# Patient Record
Sex: Female | Born: 1935 | Race: White | Hispanic: No | Marital: Married | Smoking: Never smoker
Health system: Southern US, Community
[De-identification: ages and names within clinical notes are randomized; demographics above are authoritative.]

## PROBLEM LIST (undated history)

## (undated) DIAGNOSIS — M810 Age-related osteoporosis without current pathological fracture: Secondary | ICD-10-CM

## (undated) DIAGNOSIS — M419 Scoliosis, unspecified: Secondary | ICD-10-CM

## (undated) DIAGNOSIS — I10 Essential (primary) hypertension: Secondary | ICD-10-CM

## (undated) HISTORY — PX: TONSILLECTOMY: SUR1361

## (undated) HISTORY — PX: DILATION AND CURETTAGE OF UTERUS: SHX78

## (undated) HISTORY — PX: TUBAL LIGATION: SHX77

---

## 2005-08-13 ENCOUNTER — Ambulatory Visit: Payer: Self-pay | Admitting: Family Medicine

## 2005-08-27 ENCOUNTER — Ambulatory Visit: Payer: Self-pay | Admitting: Family Medicine

## 2006-03-03 ENCOUNTER — Ambulatory Visit: Payer: Self-pay | Admitting: Family Medicine

## 2006-10-16 ENCOUNTER — Ambulatory Visit: Payer: Self-pay | Admitting: Family Medicine

## 2007-11-03 ENCOUNTER — Ambulatory Visit: Payer: Self-pay | Admitting: Family Medicine

## 2008-11-04 ENCOUNTER — Ambulatory Visit: Payer: Self-pay | Admitting: Family Medicine

## 2009-12-04 ENCOUNTER — Ambulatory Visit: Payer: Self-pay | Admitting: Family Medicine

## 2009-12-20 ENCOUNTER — Ambulatory Visit: Payer: Self-pay | Admitting: Family Medicine

## 2010-12-18 ENCOUNTER — Ambulatory Visit: Payer: Self-pay | Admitting: Family Medicine

## 2012-03-05 ENCOUNTER — Ambulatory Visit: Payer: Self-pay | Admitting: Family Medicine

## 2013-04-19 ENCOUNTER — Ambulatory Visit: Payer: Self-pay | Admitting: Family Medicine

## 2014-05-16 ENCOUNTER — Ambulatory Visit: Payer: Self-pay | Admitting: Family Medicine

## 2015-05-23 ENCOUNTER — Other Ambulatory Visit: Payer: Self-pay | Admitting: Family Medicine

## 2015-05-23 DIAGNOSIS — Z1231 Encounter for screening mammogram for malignant neoplasm of breast: Secondary | ICD-10-CM

## 2015-05-24 ENCOUNTER — Other Ambulatory Visit: Payer: Self-pay | Admitting: Family Medicine

## 2015-05-24 ENCOUNTER — Ambulatory Visit
Admission: RE | Admit: 2015-05-24 | Discharge: 2015-05-24 | Disposition: A | Discharge status: Home / Self Care | Payer: Medicare Other | Source: Ambulatory Visit | Attending: Family Medicine | Admitting: Family Medicine

## 2015-05-24 DIAGNOSIS — Z1231 Encounter for screening mammogram for malignant neoplasm of breast: Secondary | ICD-10-CM

## 2016-05-30 ENCOUNTER — Other Ambulatory Visit: Payer: Self-pay | Admitting: Family Medicine

## 2016-05-30 DIAGNOSIS — Z1231 Encounter for screening mammogram for malignant neoplasm of breast: Secondary | ICD-10-CM

## 2016-06-20 ENCOUNTER — Ambulatory Visit
Admission: RE | Admit: 2016-06-20 | Discharge: 2016-06-20 | Disposition: A | Discharge status: Home / Self Care | Payer: Medicare Other | Source: Ambulatory Visit | Attending: Family Medicine | Admitting: Family Medicine

## 2016-06-20 ENCOUNTER — Other Ambulatory Visit: Payer: Self-pay | Admitting: Family Medicine

## 2016-06-20 DIAGNOSIS — Z1231 Encounter for screening mammogram for malignant neoplasm of breast: Secondary | ICD-10-CM | POA: Diagnosis present

## 2017-06-11 ENCOUNTER — Other Ambulatory Visit: Payer: Self-pay | Admitting: Family Medicine

## 2017-06-11 DIAGNOSIS — Z1231 Encounter for screening mammogram for malignant neoplasm of breast: Secondary | ICD-10-CM

## 2017-07-02 ENCOUNTER — Ambulatory Visit
Admission: RE | Admit: 2017-07-02 | Discharge: 2017-07-02 | Disposition: A | Discharge status: Home / Self Care | Payer: Medicare Other | Source: Ambulatory Visit | Attending: Family Medicine | Admitting: Family Medicine

## 2017-07-02 DIAGNOSIS — Z1231 Encounter for screening mammogram for malignant neoplasm of breast: Secondary | ICD-10-CM

## 2018-05-13 ENCOUNTER — Other Ambulatory Visit: Payer: Self-pay | Admitting: Family Medicine

## 2018-05-13 DIAGNOSIS — M810 Age-related osteoporosis without current pathological fracture: Secondary | ICD-10-CM

## 2018-06-02 ENCOUNTER — Other Ambulatory Visit: Payer: Self-pay | Admitting: Family Medicine

## 2018-06-02 DIAGNOSIS — Z1231 Encounter for screening mammogram for malignant neoplasm of breast: Secondary | ICD-10-CM

## 2018-06-03 ENCOUNTER — Ambulatory Visit
Admission: RE | Admit: 2018-06-03 | Discharge: 2018-06-03 | Disposition: A | Discharge status: Home / Self Care | Payer: Medicare Other | Source: Ambulatory Visit | Attending: Family Medicine | Admitting: Family Medicine

## 2018-06-03 DIAGNOSIS — M810 Age-related osteoporosis without current pathological fracture: Secondary | ICD-10-CM | POA: Insufficient documentation

## 2018-07-06 ENCOUNTER — Ambulatory Visit
Admission: RE | Admit: 2018-07-06 | Discharge: 2018-07-06 | Disposition: A | Discharge status: Home / Self Care | Payer: Medicare Other | Source: Ambulatory Visit | Attending: Family Medicine | Admitting: Family Medicine

## 2018-07-06 DIAGNOSIS — Z1231 Encounter for screening mammogram for malignant neoplasm of breast: Secondary | ICD-10-CM | POA: Insufficient documentation

## 2020-07-19 ENCOUNTER — Other Ambulatory Visit: Payer: Self-pay

## 2020-07-19 ENCOUNTER — Encounter: Payer: Self-pay | Admitting: Ophthalmology

## 2020-07-24 ENCOUNTER — Other Ambulatory Visit
Admission: RE | Admit: 2020-07-24 | Discharge: 2020-07-24 | Disposition: A | Discharge status: Home / Self Care | Payer: Medicare PPO | Source: Ambulatory Visit | Attending: Ophthalmology | Admitting: Ophthalmology

## 2020-07-24 ENCOUNTER — Other Ambulatory Visit: Payer: Self-pay

## 2020-07-24 DIAGNOSIS — Z01812 Encounter for preprocedural laboratory examination: Secondary | ICD-10-CM | POA: Insufficient documentation

## 2020-07-24 DIAGNOSIS — Z20822 Contact with and (suspected) exposure to covid-19: Secondary | ICD-10-CM | POA: Diagnosis not present

## 2020-07-25 LAB — SARS CORONAVIRUS 2 (TAT 6-24 HRS): SARS Coronavirus 2: NEGATIVE

## 2020-07-25 NOTE — Discharge Instructions (Signed)

## 2020-07-26 ENCOUNTER — Other Ambulatory Visit: Payer: Self-pay

## 2020-07-26 ENCOUNTER — Encounter
Admission: RE | Disposition: A | Discharge status: Home / Self Care | Payer: Self-pay | Source: Home / Self Care | Attending: Ophthalmology

## 2020-07-26 ENCOUNTER — Encounter: Payer: Self-pay | Admitting: Ophthalmology

## 2020-07-26 ENCOUNTER — Ambulatory Visit
Admission: RE | Admit: 2020-07-26 | Discharge: 2020-07-26 | Disposition: A | Discharge status: Home / Self Care | Payer: Medicare PPO | Attending: Ophthalmology | Admitting: Ophthalmology

## 2020-07-26 ENCOUNTER — Ambulatory Visit: Payer: Medicare PPO | Admitting: Anesthesiology

## 2020-07-26 DIAGNOSIS — I10 Essential (primary) hypertension: Secondary | ICD-10-CM | POA: Diagnosis not present

## 2020-07-26 DIAGNOSIS — H2512 Age-related nuclear cataract, left eye: Secondary | ICD-10-CM | POA: Insufficient documentation

## 2020-07-26 DIAGNOSIS — M1990 Unspecified osteoarthritis, unspecified site: Secondary | ICD-10-CM | POA: Insufficient documentation

## 2020-07-26 DIAGNOSIS — Z79899 Other long term (current) drug therapy: Secondary | ICD-10-CM | POA: Insufficient documentation

## 2020-07-26 DIAGNOSIS — H5703 Miosis: Secondary | ICD-10-CM | POA: Diagnosis not present

## 2020-07-26 DIAGNOSIS — I272 Pulmonary hypertension, unspecified: Secondary | ICD-10-CM | POA: Diagnosis not present

## 2020-07-26 DIAGNOSIS — M810 Age-related osteoporosis without current pathological fracture: Secondary | ICD-10-CM | POA: Diagnosis not present

## 2020-07-26 HISTORY — DX: Age-related osteoporosis without current pathological fracture: M81.0

## 2020-07-26 HISTORY — DX: Essential (primary) hypertension: I10

## 2020-07-26 HISTORY — PX: CATARACT EXTRACTION W/PHACO: SHX586

## 2020-07-26 HISTORY — DX: Scoliosis, unspecified: M41.9

## 2020-07-26 SURGERY — PHACOEMULSIFICATION, CATARACT, WITH IOL INSERTION
Anesthesia: Monitor Anesthesia Care | Site: Eye | Laterality: Left

## 2020-07-26 MED ORDER — NA HYALUR & NA CHOND-NA HYALUR 0.4-0.35 ML IO KIT
PACK | INTRAOCULAR | Status: DC | PRN
  Administered 2020-07-26: 1 mL via INTRAOCULAR

## 2020-07-26 MED ORDER — EPINEPHRINE PF 1 MG/ML IJ SOLN
INTRAOCULAR | Status: DC | PRN
  Administered 2020-07-26: 90 mL via OPHTHALMIC

## 2020-07-26 MED ORDER — ONDANSETRON HCL 4 MG/2ML IJ SOLN: 4.0000 mg | Freq: Once | INTRAMUSCULAR | Status: DC | PRN

## 2020-07-26 MED ORDER — MIDAZOLAM HCL 2 MG/2ML IJ SOLN
INTRAMUSCULAR | Status: DC | PRN
  Administered 2020-07-26: .5 mg via INTRAVENOUS

## 2020-07-26 MED ORDER — MOXIFLOXACIN HCL 0.5 % OP SOLN
1.0000 [drp] | OPHTHALMIC | Status: DC | PRN
  Administered 2020-07-26 (×3): 1 [drp] via OPHTHALMIC

## 2020-07-26 MED ORDER — LACTATED RINGERS IV SOLN: INTRAVENOUS | Status: DC

## 2020-07-26 MED ORDER — BRIMONIDINE TARTRATE-TIMOLOL 0.2-0.5 % OP SOLN
OPHTHALMIC | Status: DC | PRN
  Administered 2020-07-26: 1 [drp] via OPHTHALMIC

## 2020-07-26 MED ORDER — ARMC OPHTHALMIC DILATING DROPS
1.0000 "application " | OPHTHALMIC | Status: DC | PRN
  Administered 2020-07-26 (×3): 1 via OPHTHALMIC

## 2020-07-26 MED ORDER — ACETAMINOPHEN 10 MG/ML IV SOLN: 1000.0000 mg | Freq: Once | INTRAVENOUS | Status: DC | PRN

## 2020-07-26 MED ORDER — FENTANYL CITRATE (PF) 100 MCG/2ML IJ SOLN
INTRAMUSCULAR | Status: DC | PRN
Start: 2020-07-26 — End: 2020-07-26
  Administered 2020-07-26 (×2): 50 ug via INTRAVENOUS

## 2020-07-26 MED ORDER — TETRACAINE HCL 0.5 % OP SOLN
1.0000 [drp] | OPHTHALMIC | Status: DC | PRN
  Administered 2020-07-26 (×3): 1 [drp] via OPHTHALMIC

## 2020-07-26 MED ORDER — LIDOCAINE HCL (PF) 2 % IJ SOLN
INTRAOCULAR | Status: DC | PRN
  Administered 2020-07-26: 2 mL

## 2020-07-26 MED ORDER — CEFUROXIME OPHTHALMIC INJECTION 1 MG/0.1 ML
INJECTION | OPHTHALMIC | Status: DC | PRN
  Administered 2020-07-26: 0.1 mL via INTRACAMERAL

## 2020-07-26 SURGICAL SUPPLY — 24 items
CANNULA ANT/CHMB 27G (MISCELLANEOUS) ×1 IMPLANT
CANNULA ANT/CHMB 27GA (MISCELLANEOUS) ×3 IMPLANT
GLOVE SURG LX 7.5 STRW (GLOVE) ×2
GLOVE SURG LX STRL 7.5 STRW (GLOVE) ×1 IMPLANT
GLOVE SURG TRIUMPH 8.0 PF LTX (GLOVE) ×3 IMPLANT
GOWN STRL REUS W/ TWL LRG LVL3 (GOWN DISPOSABLE) ×2 IMPLANT
GOWN STRL REUS W/TWL LRG LVL3 (GOWN DISPOSABLE) ×6
LENS IOL DIOP 22.0 (Intraocular Lens) ×3 IMPLANT
LENS IOL TECNIS MONO 22.0 (Intraocular Lens) IMPLANT
MARKER SKIN DUAL TIP RULER LAB (MISCELLANEOUS) ×3 IMPLANT
NDL CAPSULORHEX 25GA (NEEDLE) ×1 IMPLANT
NDL FILTER BLUNT 18X1 1/2 (NEEDLE) ×2 IMPLANT
NEEDLE CAPSULORHEX 25GA (NEEDLE) ×3 IMPLANT
NEEDLE FILTER BLUNT 18X 1/2SAF (NEEDLE) ×4
NEEDLE FILTER BLUNT 18X1 1/2 (NEEDLE) ×2 IMPLANT
PACK CATARACT BRASINGTON (MISCELLANEOUS) ×3 IMPLANT
PACK EYE AFTER SURG (MISCELLANEOUS) ×3 IMPLANT
PACK OPTHALMIC (MISCELLANEOUS) ×3 IMPLANT
RING MALYGIN 7.0 (MISCELLANEOUS) ×2 IMPLANT
SOLUTION OPHTHALMIC SALT (MISCELLANEOUS) ×3 IMPLANT
SYR 3ML LL SCALE MARK (SYRINGE) ×6 IMPLANT
SYR TB 1ML LUER SLIP (SYRINGE) ×3 IMPLANT
WATER STERILE IRR 250ML POUR (IV SOLUTION) ×3 IMPLANT
WIPE NON LINTING 3.25X3.25 (MISCELLANEOUS) ×3 IMPLANT

## 2020-07-26 NOTE — Anesthesia Procedure Notes (Signed)
Procedure Name: MAC Date/Time: 07/26/2020 12:33 PM Performed by: Vanetta Shawl, CRNA Pre-anesthesia Checklist: Patient identified, Emergency Drugs available, Suction available, Timeout performed and Patient being monitored Patient Re-evaluated:Patient Re-evaluated prior to induction Oxygen Delivery Method: Nasal cannula Placement Confirmation: positive ETCO2

## 2020-07-26 NOTE — Op Note (Signed)
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     PROCEDURE:  Phacoemulsification with posterior chamber intraocular lens implantation of the left eye which required pupil stretching with the Malyugin pupil expansion device  Ultrasound time: Procedure(s): CATARACT EXTRACTION PHACO AND INTRAOCULAR LENS PLACEMENT (IOC) LEFT MALYUGIN 5.78 01:12.2 8.0% (Left)  LENS:   Implant Name Type Inv. Item Serial No. Manufacturer Lot No. LRB No. Used Action  LENS IOL DIOP 22.0 - [id number] Intraocular Lens LENS IOL DIOP 22.0 [id number] [employer]  Left 1 Implanted     SURGEON:  Deirdre Evener, MD   ANESTHESIA: Topical with tetracaine drops and 2% Xylocaine jelly, augmented with 1% preservative-free intracameral lidocaine.   COMPLICATIONS:  None.   DESCRIPTION OF PROCEDURE:  The patient was identified in the holding room and transported to the operating room and placed in the supine position under the operating microscope.  The left eye was identified as the operative eye and it was prepped and draped in the usual sterile ophthalmic fashion.   A 1 millimeter clear-corneal paracentesis was made at the 1:30 position.  The anterior chamber was filled with Viscoat viscoelastic.  0.5 ml of preservative-free 1% lidocaine was injected into the anterior chamber.  A 2.4 millimeter keratome was used to make a near-clear corneal incision at the 10:30 position.  A Malyugin pupil expander was then placed through the main incision and into the anterior chamber of the eye.  The edge of the iris was secured on the lip of the pupil expander and it was released, thereby expanding the pupil to approximately 7 millimeters for completion of the cataract surgery.  Additional Viscoat was placed in the anterior chamber.  A  cystotome and capsulorrhexis forceps were used to make a curvilinear capsulorrhexis.   Balanced salt solution was used to hydrodissect and hydrodelineate the lens nucleus.   Phacoemulsification was used in stop and chop fashion to remove the lens, nucleus and epinucleus.  The remaining cortex was aspirated using the irrigation aspiration handpiece.  Additional Provisc was placed into the eye to distend the capsular bag for lens placement.  A lens was then injected into the capsular bag.  The pupil expanding ring was removed using a Kuglen hook and insertion device. The remaining viscoelastic was aspirated from the capsular bag and the anterior chamber.  The anterior chamber was filled with balanced salt solution to inflate to a physiologic pressure.   Wounds were hydrated with balanced salt solution.  The anterior chamber was inflated to a physiologic pressure with balanced salt solution.  No wound leaks were noted. Cefuroxime 0.1 ml of a 10mg /ml solution was injected into the anterior chamber for a dose of 1 mg of intracameral antibiotic at the completion of the case.   Timolol and Brimonidine drops were applied to the eye.  The patient was taken to the recovery room in stable condition without complications of anesthesia or surgery.  Krystal Teachey 07/26/2020, 1:02 PM

## 2020-07-26 NOTE — Anesthesia Postprocedure Evaluation (Signed)
Anesthesia Post Note  Patient: Cindy Reed  Procedure(s) Performed: CATARACT EXTRACTION PHACO AND INTRAOCULAR LENS PLACEMENT (IOC) LEFT MALYUGIN 5.78 01:12.2 8.0% (Left Eye)     Patient location during evaluation: PACU Anesthesia Type: MAC Level of consciousness: awake and alert Pain management: pain level controlled Vital Signs Assessment: post-procedure vital signs reviewed and stable Respiratory status: spontaneous breathing, nonlabored ventilation, respiratory function stable and patient connected to nasal cannula oxygen Cardiovascular status: stable and blood pressure returned to baseline Postop Assessment: no apparent nausea or vomiting Anesthetic complications: no   No complications documented.  Shrita Thien A  Jac Romulus

## 2020-07-26 NOTE — Anesthesia Preprocedure Evaluation (Signed)
Anesthesia Evaluation  Patient identified by MRN, date of birth, ID band Patient awake    Reviewed: Allergy & Precautions, NPO status , Patient's Chart, lab work & pertinent test results, reviewed documented beta blocker date and time   History of Anesthesia Complications Negative for: history of anesthetic complications  Airway Mallampati: III  TM Distance: >3 FB Neck ROM: Full  Mouth opening: Limited Mouth Opening  Dental   Pulmonary   Moderate pHTN   breath sounds clear to auscultation       Cardiovascular hypertension, (-) angina(-) DOE + Valvular Problems/Murmurs  Rhythm:Regular Rate:Normal   HLD  TTE (08/2019): MODERATE TR  MODERATE PHTN  MILD AR, MR  EF 55%    Neuro/Psych    GI/Hepatic neg GERD  ,  Endo/Other    Renal/GU      Musculoskeletal  (+) Arthritis ,   Abdominal   Peds  Hematology   Anesthesia Other Findings   Reproductive/Obstetrics                            Anesthesia Physical Anesthesia Plan  ASA: II  Anesthesia Plan: MAC   Post-op Pain Management:    Induction: Intravenous  PONV Risk Score and Plan: 2 and TIVA, Midazolam and Treatment may vary due to age or medical condition  Airway Management Planned: Nasal Cannula  Additional Equipment:   Intra-op Plan:   Post-operative Plan:   Informed Consent: I have reviewed the patients History and Physical, chart, labs and discussed the procedure including the risks, benefits and alternatives for the proposed anesthesia with the patient or authorized representative who has indicated his/her understanding and acceptance.       Plan Discussed with: CRNA and Anesthesiologist  Anesthesia Plan Comments:         Anesthesia Quick Evaluation

## 2020-07-26 NOTE — H&P (Signed)

## 2020-07-26 NOTE — Transfer of Care (Signed)
Immediate Anesthesia Transfer of Care Note  Patient: Cindy Reed  Procedure(s) Performed: CATARACT EXTRACTION PHACO AND INTRAOCULAR LENS PLACEMENT (IOC) LEFT MALYUGIN 5.78 01:12.2 8.0% (Left Eye)  Patient Location: PACU  Anesthesia Type: MAC  Level of Consciousness: awake, alert  and patient cooperative  Airway and Oxygen Therapy: Patient Spontanous Breathing   Post-op Assessment: Post-op Vital signs reviewed, Patient's Cardiovascular Status Stable, Respiratory Function Stable, Patent Airway and No signs of Nausea or vomiting  Post-op Vital Signs: Reviewed and stable  Complications: No complications documented.

## 2020-07-27 ENCOUNTER — Encounter: Payer: Self-pay | Admitting: Ophthalmology

## 2020-08-03 ENCOUNTER — Encounter: Payer: Self-pay | Admitting: Ophthalmology

## 2020-08-14 ENCOUNTER — Other Ambulatory Visit: Payer: Self-pay

## 2020-08-14 ENCOUNTER — Other Ambulatory Visit
Admission: RE | Admit: 2020-08-14 | Discharge: 2020-08-14 | Disposition: A | Discharge status: Home / Self Care | Payer: Medicare PPO | Source: Ambulatory Visit | Attending: Ophthalmology | Admitting: Ophthalmology

## 2020-08-14 DIAGNOSIS — Z01812 Encounter for preprocedural laboratory examination: Secondary | ICD-10-CM | POA: Diagnosis present

## 2020-08-14 DIAGNOSIS — Z20822 Contact with and (suspected) exposure to covid-19: Secondary | ICD-10-CM | POA: Diagnosis not present

## 2020-08-14 NOTE — Discharge Instructions (Signed)

## 2020-08-15 LAB — SARS CORONAVIRUS 2 (TAT 6-24 HRS): SARS Coronavirus 2: NEGATIVE

## 2020-08-16 ENCOUNTER — Other Ambulatory Visit: Payer: Self-pay

## 2020-08-16 ENCOUNTER — Encounter
Admission: RE | Disposition: A | Discharge status: Home / Self Care | Payer: Self-pay | Source: Home / Self Care | Attending: Ophthalmology

## 2020-08-16 ENCOUNTER — Ambulatory Visit: Payer: Medicare PPO | Admitting: Anesthesiology

## 2020-08-16 ENCOUNTER — Encounter: Payer: Self-pay | Admitting: Ophthalmology

## 2020-08-16 ENCOUNTER — Ambulatory Visit
Admission: RE | Admit: 2020-08-16 | Discharge: 2020-08-16 | Disposition: A | Discharge status: Home / Self Care | Payer: Medicare PPO | Attending: Ophthalmology | Admitting: Ophthalmology

## 2020-08-16 DIAGNOSIS — H2511 Age-related nuclear cataract, right eye: Secondary | ICD-10-CM | POA: Diagnosis present

## 2020-08-16 DIAGNOSIS — M810 Age-related osteoporosis without current pathological fracture: Secondary | ICD-10-CM | POA: Diagnosis not present

## 2020-08-16 DIAGNOSIS — M1990 Unspecified osteoarthritis, unspecified site: Secondary | ICD-10-CM | POA: Diagnosis not present

## 2020-08-16 DIAGNOSIS — Z7983 Long term (current) use of bisphosphonates: Secondary | ICD-10-CM | POA: Diagnosis not present

## 2020-08-16 DIAGNOSIS — Z79899 Other long term (current) drug therapy: Secondary | ICD-10-CM | POA: Diagnosis not present

## 2020-08-16 DIAGNOSIS — H5703 Miosis: Secondary | ICD-10-CM | POA: Diagnosis not present

## 2020-08-16 DIAGNOSIS — I272 Pulmonary hypertension, unspecified: Secondary | ICD-10-CM | POA: Insufficient documentation

## 2020-08-16 DIAGNOSIS — I10 Essential (primary) hypertension: Secondary | ICD-10-CM | POA: Insufficient documentation

## 2020-08-16 HISTORY — PX: CATARACT EXTRACTION W/PHACO: SHX586

## 2020-08-16 SURGERY — PHACOEMULSIFICATION, CATARACT, WITH IOL INSERTION
Anesthesia: Monitor Anesthesia Care | Site: Eye | Laterality: Right

## 2020-08-16 MED ORDER — LIDOCAINE HCL (PF) 2 % IJ SOLN
INTRAOCULAR | Status: DC | PRN
  Administered 2020-08-16: 2 mL

## 2020-08-16 MED ORDER — FENTANYL CITRATE (PF) 100 MCG/2ML IJ SOLN
INTRAMUSCULAR | Status: DC | PRN
Start: 2020-08-16 — End: 2020-08-16
  Administered 2020-08-16 (×2): 50 ug via INTRAVENOUS

## 2020-08-16 MED ORDER — BRIMONIDINE TARTRATE-TIMOLOL 0.2-0.5 % OP SOLN
OPHTHALMIC | Status: DC | PRN
  Administered 2020-08-16: 1 [drp] via OPHTHALMIC

## 2020-08-16 MED ORDER — ARMC OPHTHALMIC DILATING DROPS
1.0000 "application " | OPHTHALMIC | Status: DC | PRN
  Administered 2020-08-16 (×3): 1 via OPHTHALMIC

## 2020-08-16 MED ORDER — EPINEPHRINE PF 1 MG/ML IJ SOLN
INTRAOCULAR | Status: DC | PRN
  Administered 2020-08-16: 69 mL via OPHTHALMIC

## 2020-08-16 MED ORDER — CEFUROXIME OPHTHALMIC INJECTION 1 MG/0.1 ML
INJECTION | OPHTHALMIC | Status: DC | PRN
  Administered 2020-08-16: 0.1 mL via INTRACAMERAL

## 2020-08-16 MED ORDER — ONDANSETRON HCL 4 MG/2ML IJ SOLN: 4.0000 mg | Freq: Once | INTRAMUSCULAR | Status: DC | PRN

## 2020-08-16 MED ORDER — TETRACAINE HCL 0.5 % OP SOLN
1.0000 [drp] | OPHTHALMIC | Status: DC | PRN
  Administered 2020-08-16 (×3): 1 [drp] via OPHTHALMIC

## 2020-08-16 MED ORDER — NA HYALUR & NA CHOND-NA HYALUR 0.4-0.35 ML IO KIT
PACK | INTRAOCULAR | Status: DC | PRN
  Administered 2020-08-16: 1 mL via INTRAOCULAR

## 2020-08-16 MED ORDER — ACETAMINOPHEN 325 MG PO TABS: 325.0000 mg | ORAL_TABLET | ORAL | Status: DC | PRN

## 2020-08-16 MED ORDER — MOXIFLOXACIN HCL 0.5 % OP SOLN
1.0000 [drp] | OPHTHALMIC | Status: DC | PRN
  Administered 2020-08-16 (×3): 1 [drp] via OPHTHALMIC

## 2020-08-16 MED ORDER — ACETAMINOPHEN 160 MG/5ML PO SOLN: 325.0000 mg | ORAL | Status: DC | PRN

## 2020-08-16 SURGICAL SUPPLY — 24 items
CANNULA ANT/CHMB 27G (MISCELLANEOUS) ×1 IMPLANT
CANNULA ANT/CHMB 27GA (MISCELLANEOUS) ×2 IMPLANT
GLOVE SURG LX 7.5 STRW (GLOVE) ×1
GLOVE SURG LX STRL 7.5 STRW (GLOVE) ×1 IMPLANT
GLOVE SURG TRIUMPH 8.0 PF LTX (GLOVE) ×2 IMPLANT
GOWN STRL REUS W/ TWL LRG LVL3 (GOWN DISPOSABLE) ×2 IMPLANT
GOWN STRL REUS W/TWL LRG LVL3 (GOWN DISPOSABLE) ×4
LENS IOL DIOP 22.0 (Intraocular Lens) ×2 IMPLANT
LENS IOL TECNIS MONO 22.0 (Intraocular Lens) IMPLANT
MARKER SKIN DUAL TIP RULER LAB (MISCELLANEOUS) ×2 IMPLANT
NDL CAPSULORHEX 25GA (NEEDLE) ×1 IMPLANT
NDL FILTER BLUNT 18X1 1/2 (NEEDLE) ×2 IMPLANT
NEEDLE CAPSULORHEX 25GA (NEEDLE) ×2 IMPLANT
NEEDLE FILTER BLUNT 18X 1/2SAF (NEEDLE) ×2
NEEDLE FILTER BLUNT 18X1 1/2 (NEEDLE) ×2 IMPLANT
PACK CATARACT BRASINGTON (MISCELLANEOUS) ×2 IMPLANT
PACK EYE AFTER SURG (MISCELLANEOUS) ×2 IMPLANT
PACK OPTHALMIC (MISCELLANEOUS) ×2 IMPLANT
RING MALYGIN 7.0 (MISCELLANEOUS) ×1 IMPLANT
SOLUTION OPHTHALMIC SALT (MISCELLANEOUS) ×2 IMPLANT
SYR 3ML LL SCALE MARK (SYRINGE) ×4 IMPLANT
SYR TB 1ML LUER SLIP (SYRINGE) ×2 IMPLANT
WATER STERILE IRR 250ML POUR (IV SOLUTION) ×2 IMPLANT
WIPE NON LINTING 3.25X3.25 (MISCELLANEOUS) ×2 IMPLANT

## 2020-08-16 NOTE — H&P (Signed)

## 2020-08-16 NOTE — Op Note (Signed)
OPERATIVE NOTE  Cindy Reed [id number] 08/16/2020   PREOPERATIVE DIAGNOSIS:    Nuclear Sclerotic Cataract Right eye with miotic pupil.        H25.11  POSTOPERATIVE DIAGNOSIS: Nuclear Sclerotic Cataract Right eye with miotic pupil.          PROCEDURE:  Phacoemusification with posterior chamber intraocular lens placement of the right eye which required pupil stretching with the Malyugin pupil expansion device. Ultrasound time: Procedure(s): CATARACT EXTRACTION PHACO AND INTRAOCULAR LENS PLACEMENT (IOC) RIGHT MALYUGIN 7.26 01:05.8 11.0% (Right)  LENS:   Implant Name Type Inv. Item Serial No. Manufacturer Lot No. LRB No. Used Action  LENS IOL DIOP 22.0 - [id number] Intraocular Lens LENS IOL DIOP 22.0 [id number]   Right 1 Implanted      SURGEON:  Deirdre Evener, MD   ANESTHESIA:  Topical with tetracaine drops and 2% Xylocaine jelly, augmented with 1% preservative-free intracameral lidocaine.   COMPLICATIONS:  None.   DESCRIPTION OF PROCEDURE:  The patient was identified in the holding room and transported to the operating room and placed in the supine position under the operating microscope. Theright eye was identified as the operative eye and it was prepped and draped in the usual sterile ophthalmic fashion.   A 1 millimeter clear-corneal paracentesis was made at the 12:00 position.  0.5 ml of preservative-free 1% lidocaine was injected into the anterior chamber. The anterior chamber was filled with Viscoat viscoelastic.  A 2.4 millimeter keratome was used to make a near-clear corneal incision at the 9:00 position. A Malyugin pupil expander was then placed through the main incision and into the anterior chamber of the eye.  The edge of the iris was secured on the lip of the pupil expander and it was released, thereby expanding the pupil to approximately 7 millimeters for completion of the cataract surgery.  Additional Viscoat was placed in the anterior chamber.  A cystotome  and capsulorrhexis forceps were used to make a curvilinear capsulorrhexis.   Balanced salt solution was used to hydrodissect and hydrodelineate the lens nucleus.   Phacoemulsification was used in stop and chop fashion to remove the lens, nucleus and epinucleus.  The remaining cortex was aspirated using the irrigation aspiration handpiece.  Additional Provisc was placed into the eye to distend the capsular bag for lens placement.  A lens was then injected into the capsular bag.  The pupil expanding ring was removed using a Kuglen hook and insertion device. The remaining viscoelastic was aspirated from the capsular bag and the anterior chamber.  The anterior chamber was filled with balanced salt solution to inflate to a physiologic pressure.  Wounds were hydrated with balanced salt solution.  The anterior chamber was inflated to a physiologic pressure with balanced salt solution.  No wound leaks were noted.Cefuroxime 0.1 ml of a 10mg /ml solution was injected into the anterior chamber for a dose of 1 mg of intracameral antibiotic at the completion of the case. Timolol and Brimonidine drops were applied to the eye.  The patient was taken to the recovery room in stable condition without complications of anesthesia or surgery.  [name] 08/16/2020, 9:28 AM

## 2020-08-16 NOTE — Anesthesia Preprocedure Evaluation (Signed)
Anesthesia Evaluation  Patient identified by MRN, date of birth, ID band Patient awake    Reviewed: Allergy & Precautions, NPO status , Patient's Chart, lab work & pertinent test results, reviewed documented beta blocker date and time   History of Anesthesia Complications Negative for: history of anesthetic complications  Airway Mallampati: III  TM Distance: >3 FB Neck ROM: Full  Mouth opening: Limited Mouth Opening  Dental   Pulmonary   Moderate pHTN   breath sounds clear to auscultation       Cardiovascular Exercise Tolerance: Good hypertension, (-) angina(-) DOE Normal cardiovascular exam+ Valvular Problems/Murmurs  Rhythm:Regular Rate:Normal   HLD  TTE (08/2019): MODERATE TR  MODERATE PHTN  MILD AR, MR  EF 55%    Neuro/Psych negative neurological ROS  negative psych ROS   GI/Hepatic Neg liver ROS, neg GERD  ,  Endo/Other  negative endocrine ROS  Renal/GU negative Renal ROS  negative genitourinary   Musculoskeletal  (+) Arthritis ,   Abdominal Normal abdominal exam  (+) - obese,   Peds negative pediatric ROS (+)  Hematology negative hematology ROS (+)   Anesthesia Other Findings   Reproductive/Obstetrics negative OB ROS                             Anesthesia Physical  Anesthesia Plan  ASA: II  Anesthesia Plan: MAC   Post-op Pain Management:    Induction: Intravenous  PONV Risk Score and Plan: 2 and TIVA, Midazolam and Treatment may vary due to age or medical condition  Airway Management Planned: Nasal Cannula and Natural Airway  Additional Equipment:   Intra-op Plan:   Post-operative Plan:   Informed Consent: I have reviewed the patients History and Physical, chart, labs and discussed the procedure including the risks, benefits and alternatives for the proposed anesthesia with the patient or authorized representative who has indicated his/her understanding and  acceptance.       Plan Discussed with: CRNA and Anesthesiologist  Anesthesia Plan Comments:         Anesthesia Quick Evaluation   There are no problems to display for this patient.   No flowsheet data found. No flowsheet data found.  Risks and benefits of anesthesia discussed at length, patient or surrogate demonstrates understanding. Appropriately NPO. Plan to proceed with anesthesia.  Champ Mungo, MD 08/16/20

## 2020-08-16 NOTE — Anesthesia Procedure Notes (Signed)
Procedure Name: MAC Date/Time: 08/16/2020 9:10 AM Performed by: Jeannene Patella, CRNA Pre-anesthesia Checklist: Patient identified, Emergency Drugs available, Suction available, Timeout performed and Patient being monitored Patient Re-evaluated:Patient Re-evaluated prior to induction Oxygen Delivery Method: Nasal cannula Placement Confirmation: positive ETCO2

## 2020-08-16 NOTE — Anesthesia Postprocedure Evaluation (Signed)
Anesthesia Post Note  Patient: Cindy Reed  Procedure(s) Performed: CATARACT EXTRACTION PHACO AND INTRAOCULAR LENS PLACEMENT (IOC) RIGHT MALYUGIN 7.26 01:05.8 11.0% (Right Eye)     Patient location during evaluation: PACU Anesthesia Type: MAC Level of consciousness: awake and alert Pain management: pain level controlled Vital Signs Assessment: post-procedure vital signs reviewed and stable Respiratory status: spontaneous breathing, nonlabored ventilation, respiratory function stable and patient connected to nasal cannula oxygen Cardiovascular status: stable and blood pressure returned to baseline Postop Assessment: no apparent nausea or vomiting Anesthetic complications: no   No complications documented.  [name]

## 2020-08-16 NOTE — Transfer of Care (Signed)
Immediate Anesthesia Transfer of Care Note  Patient: Cindy Reed  Procedure(s) Performed: CATARACT EXTRACTION PHACO AND INTRAOCULAR LENS PLACEMENT (IOC) RIGHT MALYUGIN 7.26 01:05.8 11.0% (Right Eye)  Patient Location: PACU  Anesthesia Type: MAC  Level of Consciousness: awake, alert  and patient cooperative  Airway and Oxygen Therapy: Patient Spontanous Breathing and Patient connected to supplemental oxygen  Post-op Assessment: Post-op Vital signs reviewed, Patient's Cardiovascular Status Stable, Respiratory Function Stable, Patent Airway and No signs of Nausea or vomiting  Post-op Vital Signs: Reviewed and stable  Complications: No complications documented.

## 2020-08-17 ENCOUNTER — Encounter: Payer: Self-pay | Admitting: Ophthalmology

## 2022-08-16 ENCOUNTER — Emergency Department: Payer: Medicare PPO

## 2022-08-16 ENCOUNTER — Encounter: Payer: Self-pay | Admitting: Emergency Medicine

## 2022-08-16 DIAGNOSIS — Z79899 Other long term (current) drug therapy: Secondary | ICD-10-CM | POA: Diagnosis not present

## 2022-08-16 DIAGNOSIS — I480 Paroxysmal atrial fibrillation: Secondary | ICD-10-CM | POA: Diagnosis not present

## 2022-08-16 DIAGNOSIS — S0101XA Laceration without foreign body of scalp, initial encounter: Secondary | ICD-10-CM | POA: Diagnosis not present

## 2022-08-16 DIAGNOSIS — Z23 Encounter for immunization: Secondary | ICD-10-CM | POA: Insufficient documentation

## 2022-08-16 DIAGNOSIS — R55 Syncope and collapse: Secondary | ICD-10-CM | POA: Insufficient documentation

## 2022-08-16 DIAGNOSIS — Z7982 Long term (current) use of aspirin: Secondary | ICD-10-CM | POA: Insufficient documentation

## 2022-08-16 DIAGNOSIS — I129 Hypertensive chronic kidney disease with stage 1 through stage 4 chronic kidney disease, or unspecified chronic kidney disease: Secondary | ICD-10-CM | POA: Diagnosis not present

## 2022-08-16 DIAGNOSIS — S066X0A Traumatic subarachnoid hemorrhage without loss of consciousness, initial encounter: Principal | ICD-10-CM | POA: Insufficient documentation

## 2022-08-16 DIAGNOSIS — Y92009 Unspecified place in unspecified non-institutional (private) residence as the place of occurrence of the external cause: Secondary | ICD-10-CM | POA: Diagnosis not present

## 2022-08-16 DIAGNOSIS — N1831 Chronic kidney disease, stage 3a: Secondary | ICD-10-CM | POA: Diagnosis not present

## 2022-08-16 DIAGNOSIS — E876 Hypokalemia: Secondary | ICD-10-CM | POA: Insufficient documentation

## 2022-08-16 DIAGNOSIS — Z20822 Contact with and (suspected) exposure to covid-19: Secondary | ICD-10-CM | POA: Insufficient documentation

## 2022-08-16 DIAGNOSIS — R2681 Unsteadiness on feet: Secondary | ICD-10-CM | POA: Diagnosis present

## 2022-08-16 DIAGNOSIS — W01198A Fall on same level from slipping, tripping and stumbling with subsequent striking against other object, initial encounter: Secondary | ICD-10-CM | POA: Diagnosis not present

## 2022-08-16 DIAGNOSIS — D62 Acute posthemorrhagic anemia: Secondary | ICD-10-CM | POA: Insufficient documentation

## 2022-08-16 NOTE — ED Triage Notes (Signed)
Pt presents via EMS from home with complaints of fall at home - landing on the ground hitting her head. Pt has a hematoma to the posterior aspect of her head. Denies LOC - not on thinners.   Pt received fentanyl and 4mg  of Zofran via 18G FA. Denies CP or SOB.

## 2022-08-17 ENCOUNTER — Emergency Department: Payer: Medicare PPO

## 2022-08-17 ENCOUNTER — Observation Stay
Admission: EM | Admit: 2022-08-17 | Discharge: 2022-08-17 | Disposition: A | Discharge status: Home Health | Payer: Medicare PPO | Attending: Hospitalist | Admitting: Hospitalist

## 2022-08-17 ENCOUNTER — Observation Stay: Payer: Medicare PPO

## 2022-08-17 ENCOUNTER — Other Ambulatory Visit: Payer: Self-pay

## 2022-08-17 DIAGNOSIS — E871 Hypo-osmolality and hyponatremia: Secondary | ICD-10-CM

## 2022-08-17 DIAGNOSIS — E876 Hypokalemia: Secondary | ICD-10-CM

## 2022-08-17 DIAGNOSIS — N1831 Chronic kidney disease, stage 3a: Secondary | ICD-10-CM

## 2022-08-17 DIAGNOSIS — S066X0A Traumatic subarachnoid hemorrhage without loss of consciousness, initial encounter: Secondary | ICD-10-CM

## 2022-08-17 DIAGNOSIS — S0101XA Laceration without foreign body of scalp, initial encounter: Secondary | ICD-10-CM

## 2022-08-17 DIAGNOSIS — S066XAA Traumatic subarachnoid hemorrhage with loss of consciousness status unknown, initial encounter: Secondary | ICD-10-CM | POA: Diagnosis present

## 2022-08-17 DIAGNOSIS — R42 Dizziness and giddiness: Secondary | ICD-10-CM

## 2022-08-17 DIAGNOSIS — D62 Acute posthemorrhagic anemia: Secondary | ICD-10-CM

## 2022-08-17 DIAGNOSIS — R55 Syncope and collapse: Secondary | ICD-10-CM

## 2022-08-17 DIAGNOSIS — I10 Essential (primary) hypertension: Secondary | ICD-10-CM | POA: Insufficient documentation

## 2022-08-17 DIAGNOSIS — I480 Paroxysmal atrial fibrillation: Secondary | ICD-10-CM

## 2022-08-17 LAB — CBC WITH DIFFERENTIAL/PLATELET
Abs Immature Granulocytes: 0.04 10*3/uL (ref 0.00–0.07)
Basophils Absolute: 0 10*3/uL (ref 0.0–0.1)
Basophils Relative: 1 %
Eosinophils Absolute: 0 10*3/uL (ref 0.0–0.5)
Eosinophils Relative: 0 %
HCT: 33.2 % — ABNORMAL LOW (ref 36.0–46.0)
Hemoglobin: 11.1 g/dL — ABNORMAL LOW (ref 12.0–15.0)
Immature Granulocytes: 1 %
Lymphocytes Relative: 6 %
Lymphs Abs: 0.5 10*3/uL — ABNORMAL LOW (ref 0.7–4.0)
MCH: 29.7 pg (ref 26.0–34.0)
MCHC: 33.4 g/dL (ref 30.0–36.0)
MCV: 88.8 fL (ref 80.0–100.0)
Monocytes Absolute: 0.8 10*3/uL (ref 0.1–1.0)
Monocytes Relative: 9 %
Neutro Abs: 7 10*3/uL (ref 1.7–7.7)
Neutrophils Relative %: 83 %
Platelets: 206 10*3/uL (ref 150–400)
RBC: 3.74 MIL/uL — ABNORMAL LOW (ref 3.87–5.11)
RDW: 13.7 % (ref 11.5–15.5)
WBC: 8.4 10*3/uL (ref 4.0–10.5)
nRBC: 0 % (ref 0.0–0.2)

## 2022-08-17 LAB — COMPREHENSIVE METABOLIC PANEL
ALT: 35 U/L (ref 0–44)
AST: 35 U/L (ref 15–41)
Albumin: 3.9 g/dL (ref 3.5–5.0)
Alkaline Phosphatase: 69 U/L (ref 38–126)
Anion gap: 10 (ref 5–15)
BUN: 16 mg/dL (ref 8–23)
CO2: 26 mmol/L (ref 22–32)
Calcium: 9 mg/dL (ref 8.9–10.3)
Chloride: 93 mmol/L — ABNORMAL LOW (ref 98–111)
Creatinine, Ser: 0.97 mg/dL (ref 0.44–1.00)
GFR, Estimated: 56 mL/min — ABNORMAL LOW (ref >60)
Glucose, Bld: 118 mg/dL — ABNORMAL HIGH (ref 70–99)
Potassium: 3.3 mmol/L — ABNORMAL LOW (ref 3.5–5.1)
Sodium: 129 mmol/L — ABNORMAL LOW (ref 135–145)
Total Bilirubin: 0.9 mg/dL (ref 0.3–1.2)
Total Protein: 6.4 g/dL — ABNORMAL LOW (ref 6.5–8.1)

## 2022-08-17 LAB — SARS CORONAVIRUS 2 BY RT PCR: SARS Coronavirus 2 by RT PCR: NEGATIVE

## 2022-08-17 LAB — HEMOGLOBIN AND HEMATOCRIT, BLOOD
HCT: 28.5 % — ABNORMAL LOW (ref 36.0–46.0)
Hemoglobin: 9.5 g/dL — ABNORMAL LOW (ref 12.0–15.0)

## 2022-08-17 LAB — PROTIME-INR
INR: 1.1 (ref 0.8–1.2)
Prothrombin Time: 13.6 seconds (ref 11.4–15.2)

## 2022-08-17 LAB — VITAMIN B12: Vitamin B-12: 170 pg/mL — ABNORMAL LOW (ref 180–914)

## 2022-08-17 LAB — APTT: aPTT: 26 seconds (ref 24–36)

## 2022-08-17 MED ORDER — ASPIRIN 81 MG PO TBEC: 81.0000 mg | DELAYED_RELEASE_TABLET | Freq: Every day | ORAL | Status: AC

## 2022-08-17 MED ORDER — POTASSIUM CHLORIDE CRYS ER 20 MEQ PO TBCR
40.0000 meq | EXTENDED_RELEASE_TABLET | Freq: Once | ORAL | Status: AC
  Administered 2022-08-17: 40 meq via ORAL
  Filled 2022-08-17: qty 2

## 2022-08-17 MED ORDER — ONDANSETRON HCL 4 MG/2ML IJ SOLN: 4.0000 mg | Freq: Four times a day (QID) | INTRAMUSCULAR | Status: DC | PRN

## 2022-08-17 MED ORDER — SODIUM CHLORIDE 0.9 % IV SOLN: INTRAVENOUS | Status: AC

## 2022-08-17 MED ORDER — TETANUS-DIPHTH-ACELL PERTUSSIS 5-2.5-18.5 LF-MCG/0.5 IM SUSY
0.5000 mL | PREFILLED_SYRINGE | Freq: Once | INTRAMUSCULAR | Status: AC
  Administered 2022-08-17: 0.5 mL via INTRAMUSCULAR
  Filled 2022-08-17: qty 0.5

## 2022-08-17 MED ORDER — ACETAMINOPHEN 650 MG RE SUPP: 650.0000 mg | Freq: Four times a day (QID) | RECTAL | Status: DC | PRN

## 2022-08-17 MED ORDER — LIDOCAINE-EPINEPHRINE 2 %-1:100000 IJ SOLN
20.0000 mL | Freq: Once | INTRAMUSCULAR | Status: AC
  Administered 2022-08-17: 20 mL via INTRADERMAL
  Filled 2022-08-17: qty 1

## 2022-08-17 MED ORDER — FLUOXETINE HCL 20 MG PO CAPS
20.0000 mg | ORAL_CAPSULE | Freq: Every day | ORAL | Status: DC
  Administered 2022-08-17: 20 mg via ORAL
  Filled 2022-08-17: qty 1

## 2022-08-17 MED ORDER — ONDANSETRON HCL 4 MG/2ML IJ SOLN
4.0000 mg | Freq: Once | INTRAMUSCULAR | Status: AC
  Administered 2022-08-17: 4 mg via INTRAVENOUS
  Filled 2022-08-17: qty 2

## 2022-08-17 MED ORDER — ACETAMINOPHEN 500 MG PO TABS
1000.0000 mg | ORAL_TABLET | Freq: Once | ORAL | Status: AC
  Administered 2022-08-17: 1000 mg via ORAL
  Filled 2022-08-17: qty 2

## 2022-08-17 MED ORDER — HYDROCODONE-ACETAMINOPHEN 5-325 MG PO TABS: 1.0000 | ORAL_TABLET | ORAL | Status: DC | PRN

## 2022-08-17 MED ORDER — ACETAMINOPHEN 325 MG PO TABS
650.0000 mg | ORAL_TABLET | Freq: Four times a day (QID) | ORAL | Status: DC | PRN
  Administered 2022-08-17: 650 mg via ORAL
  Filled 2022-08-17: qty 2

## 2022-08-17 MED ORDER — POTASSIUM CHLORIDE CRYS ER 20 MEQ PO TBCR
10.0000 meq | EXTENDED_RELEASE_TABLET | Freq: Every day | ORAL | Status: DC
  Administered 2022-08-17: 10 meq via ORAL
  Filled 2022-08-17: qty 1

## 2022-08-17 MED ORDER — ONDANSETRON HCL 4 MG PO TABS: 4.0000 mg | ORAL_TABLET | Freq: Four times a day (QID) | ORAL | Status: DC | PRN

## 2022-08-17 MED ORDER — SODIUM CHLORIDE 0.9 % IV BOLUS
1000.0000 mL | Freq: Once | INTRAVENOUS | Status: AC
  Administered 2022-08-17: 1000 mL via INTRAVENOUS

## 2022-08-17 MED ORDER — AMIODARONE HCL 200 MG PO TABS
200.0000 mg | ORAL_TABLET | Freq: Every day | ORAL | Status: DC
  Administered 2022-08-17: 200 mg via ORAL
  Filled 2022-08-17: qty 1

## 2022-08-17 MED ORDER — LEVOTHYROXINE SODIUM 25 MCG PO TABS
25.0000 ug | ORAL_TABLET | Freq: Every day | ORAL | Status: DC
  Administered 2022-08-17: 25 ug via ORAL
  Filled 2022-08-17: qty 1

## 2022-08-17 NOTE — Discharge Summary (Signed)
Physician Discharge Summary   Cindy Reed  female DOB: 07-05-1931  VOZ:[id number]  PCP: [name], MD  Admit date: 08/17/2022 Discharge date: 08/17/2022  Admitted From: home Disposition:  home Home Health: Yes CODE STATUS: Full code  Discharge Instructions     Discharge instructions   Complete by: As directed    Please hold your aspirin for a week. - -   No wound care   Complete by: As directed       Hospital Course:  For full details, please see H&P, progress notes, consult notes and ancillary notes.  Briefly,  Cindy Reed is a 88 y.o. female with medical history significant for atrial fibrillation diagnosed in April 2023 on amiodarone and aspirin, (self discontinued Eliquis early July due to easy bruising), as well as history of pulmonary hypertension, vertigo, CKD 3a, who presented to the ED following a fall.    Pt felt "swimmy headed" and as she stood up, immediately felt unsteady, falling backwards hitting her head sustaining a laceration to the top of her head.  She did not lose consciousness.   Pt was in her usual state of health prior to the fall.  Pt had been seen by ENT for evaluation of her inner ear and she was subsequently referred to a neurologist but never made it there as the appointment was several months out.   * Traumatic subarachnoid hemorrhage (HCC) --CT head showed Acute right frontal subarachnoid hemorrhage and A 1.7 cm left occipital scalp acute hematoma formation.  Repeat CT head x2 showed stability.  Neurosurgery saw pt and cleared for discharge with no f/u needed.  Rec holding home ASA for 1 week.  Postural dizziness with presyncope and fall --Patient had new diagnosis of A-fib in April and had echocardiogram and Holter monitor at that time --neuro consulted, and noted pt's 1 year hx of progressive disequilibrium likely represents "multifactorial gait disorder including mild proprioceptive loss, scoliosis with change of center of gravity,  possibly with some contribution of cerebellar dysfunction." --orthostatic BP neg.   --vit B12 result returned after pt was discharged, and it was low at 170.     Hyponatremia NS bolus given in the ED.     Acute blood loss anemia secondary to scalp laceration/hematoma(ABLA) Hemoglobin 11.1>9.5, down from 13 on 07/29/22. Secondary to bleeding from scalp wound which appears to have stopped with pressure dressing and laceration repair.  Could be partly hemo dilutional as patient received a 1 L fluid bolus in the ED.    Hypokalemia Home Maxzide was on hold PTA, and pt was not taking potassium supplement PTA. --supplemented with Oral potassium during hospitalization.   Scalp laceration, initial encounter Received tetanus shot and laceration repair in the ED   Stage 3a chronic kidney disease ([place]) Renal function at baseline   Paroxysmal atrial fibrillation (HCC) On amiodarone and aspirin Patient self discontinued Eliquis in July 2023  Unless noted above, medications under "STOP" list are ones pt was not taking PTA.  Discharge Diagnoses:  Principal Problem:   Traumatic subarachnoid hemorrhage ([place]) Active Problems:   Postural dizziness with presyncope   Acute blood loss anemia secondary to scalp laceration/hematoma(ABLA)   Hyponatremia   Scalp laceration, initial encounter   Hypokalemia   Paroxysmal atrial fibrillation (HCC)   Stage 3a chronic kidney disease ([place])     Discharge Instructions:  Allergies as of 08/17/2022   No Known Allergies      Medication List     STOP taking these medications  hydrochlorothiazide 12.5 MG tablet Commonly known as: HYDRODIURIL   potassium chloride 10 MEQ tablet Commonly known as: KLOR-CON M   triamterene-hydrochlorothiazide 37.5-25 MG tablet Commonly known as: MAXZIDE-25       TAKE these medications    amiodarone 200 MG tablet Commonly known as: PACERONE Take 200 mg by mouth daily.   aspirin EC 81 MG tablet Take 1  tablet (81 mg total) by mouth daily. Start taking on: August 24, 2022 What changed: These instructions start on August 24, 2022. If you are unsure what to do until then, ask your doctor or other care provider.   CALCIUM 600 + D PO Take by mouth in the morning and at bedtime.   denosumab 60 MG/ML Sosy injection Commonly known as: PROLIA Inject 60 mg into the skin every 6 (six) months.   fexofenadine 180 MG tablet Commonly known as: ALLEGRA Take 18 mg by mouth daily as needed.   FLUoxetine 20 MG capsule Commonly known as: PROZAC Take 20 mg by mouth daily. What changed: Another medication with the same name was removed. Continue taking this medication, and follow the directions you see here.   levothyroxine 25 MCG tablet Commonly known as: SYNTHROID Take 25 mcg by mouth daily.   vitamin D3 25 MCG tablet Commonly known as: CHOLECALCIFEROL Take 1,000 Units by mouth daily.               Durable Medical Equipment  (From admission, onward)           Start     Ordered   08/17/22 1224  For home use only DME Walker rolling  Once       Question Answer Comment  Walker: With 5 Inch Wheels   Patient needs a walker to treat with the following condition Weakness generalized      08/17/22 1224             Follow-up Information     [name], MD Follow up in 1 week(s).   Specialty: Family Medicine Contact information: [place] [address] [phone]                 No Known Allergies   The results of significant diagnostics from this hospitalization (including imaging, microbiology, ancillary and laboratory) are listed below for reference.   Consultations:   Procedures/Studies: CT HEAD WO CONTRAST (5MM)  Result Date: 08/17/2022 CLINICAL DATA:  88 year old female status post fall at home. Acute intracranial hemorrhage. EXAM: CT HEAD WITHOUT CONTRAST TECHNIQUE: Contiguous axial images were obtained from the base of the  skull through the vertex without intravenous contrast. RADIATION DOSE REDUCTION: This exam was performed according to the departmental dose-optimization program which includes automated exposure control, adjustment of the mA and/or kV according to patient size and/or use of iterative reconstruction technique. COMPARISON:  Head CT 0243 hours today, 0007 hours today. FINDINGS: Brain: Anterior right frontal lobe probable hemorrhagic contusion with small volume adjacent extra-axial hemorrhage has not significantly changed since 0007 hours today (series 2, image 20 now versus series 2, image 19 of that exam). Mild regional edema. No associated mass effect. No IVH or new intracranial hemorrhage identified. Stable gray-white matter differentiation throughout the brain. No cortically based acute infarct identified. No midline shift or ventriculomegaly. Basilar cisterns remain normal. Vascular: Calcified atherosclerosis at the skull base. No suspicious intracranial vascular hyperdensity. Skull: Stable.  No acute osseous abnormality identified. Sinuses/Orbits: Visualized paranasal sinuses and mastoids are stable and well aerated. Other: Broad-based posterior left  scalp hematoma and laceration with tracking subcutaneous gas. This has mildly regressed since 0007 hours. Underlying calvarium appears intact. Other orbit and scalp soft tissues appears stable and intact. IMPRESSION: 1. Anterior right frontal lobe hemorrhagic contusion and small volume adjacent extra-axial blood has not significantly changed since 0007 hours today. Mild regional edema but no associated mass effect. 2. No new intracranial abnormality. 3. Left posterior scalp hematoma and laceration has mildly regressed. No underlying skull fracture identified. Electronically Signed   By: Genevie Janeliz M.D.   On: 08/17/2022 06:45   CT HEAD WO CONTRAST (5MM)  Result Date: 08/17/2022 CLINICAL DATA:  Hemorrhage follow-up EXAM: CT HEAD WITHOUT CONTRAST TECHNIQUE: Contiguous  axial images were obtained from the base of the skull through the vertex without intravenous contrast. RADIATION DOSE REDUCTION: This exam was performed according to the departmental dose-optimization program which includes automated exposure control, adjustment of the mA and/or kV according to patient size and/or use of iterative reconstruction technique. COMPARISON:  08/17/2022 FINDINGS: Brain: Unchanged area of contrecoup pattern mixed subarachnoid and intraparenchymal hemorrhage at the right frontal pole. There is periventricular hypoattenuation compatible with chronic microvascular disease. Vascular: ICA atherosclerosis at the skull base. Skull: No skull fracture. Decreased size of left posterior scalp hematoma. Sinuses/Orbits: No acute finding. Other: None. IMPRESSION: Unchanged area of contrecoup pattern mixed subarachnoid and intraparenchymal hemorrhage at the right frontal pole. Electronically Signed   By: Ulyses Jarred M.D.   On: 08/17/2022 03:05   CT HEAD WO CONTRAST (5MM)  Addendum Date: 08/17/2022   ADDENDUM REPORT: 08/17/2022 00:20 ADDENDUM: These results were called by telephone at the time of interpretation on 08/17/2022 at 12:20 am to provider [facility] , who verbally acknowledged these results. Electronically Signed   By: Iven Finn M.D.   On: 08/17/2022 00:20   Result Date: 08/17/2022 CLINICAL DATA:  Head trauma, moderate-severe fall; Neck trauma (Age >= 65y) fall. h complaints of fall at home - landing on the ground hitting her head. Pt has a hematoma to the posterior aspect of her head. EXAM: CT HEAD WITHOUT CONTRAST CT CERVICAL SPINE WITHOUT CONTRAST TECHNIQUE: Multidetector CT imaging of the head and cervical spine was performed following the standard protocol without intravenous contrast. Multiplanar CT image reconstructions of the cervical spine were also generated. RADIATION DOSE REDUCTION: This exam was performed according to the departmental dose-optimization program which  includes automated exposure control, adjustment of the mA and/or kV according to patient size and/or use of iterative reconstruction technique. COMPARISON:  None Available. FINDINGS: CT HEAD FINDINGS Brain: No evidence of large-territorial acute infarction. No definite parenchymal hemorrhage. No mass lesion. Acute moderate volume right frontal subarachnoid hemorrhage. No mass effect or midline shift. No hydrocephalus. Basilar cisterns are patent. Vascular: No hyperdense vessel. Atherosclerotic calcifications are present within the cavernous internal carotid and vertebral arteries. Skull: No acute fracture or focal lesion. Sinuses/Orbits: Paranasal sinuses and mastoid air cells are clear. Bilateral lens replacement. Otherwise the orbits are unremarkable. Other: A 1.7 cm left occipital scalp acute hematoma formation. Associated foci of gas suggestive of overlying laceration/soft tissue defect. CT CERVICAL SPINE FINDINGS Alignment: Normal. Skull base and vertebrae: Multilevel severe degenerative changes of the spine most prominent at the C3 through C6 levels. Associated severe right C3-C4 osseous neural foraminal stenosis. No severe osseous central canal stenosis. Soft tissues and spinal canal: No prevertebral fluid or swelling. No visible canal hematoma. Upper chest: Biapical pleural/pulmonary scarring. Other: None. IMPRESSION: 1. Acute right frontal subarachnoid hemorrhage with underlying parenchymal hemorrhage not excluded.  Recommend follow-up CT head to evaluate for a blooming parenchymal contusion. 2. No acute displaced fracture or traumatic listhesis of the cervical spine. 3. A 1.7 cm left occipital scalp acute hematoma formation. 4. Degenerative changes of the spine with associated severe right C3-C4 osseous neural foraminal stenosis. Electronically Signed: By: Iven Finn M.D. On: 08/17/2022 00:17   CT Cervical Spine Wo Contrast  Addendum Date: 08/17/2022   ADDENDUM REPORT: 08/17/2022 00:20 ADDENDUM:  These results were called by telephone at the time of interpretation on 08/17/2022 at 12:20 am to provider [facility] , who verbally acknowledged these results. Electronically Signed   By: Iven Finn M.D.   On: 08/17/2022 00:20   Result Date: 08/17/2022 CLINICAL DATA:  Head trauma, moderate-severe fall; Neck trauma (Age >= 65y) fall. h complaints of fall at home - landing on the ground hitting her head. Pt has a hematoma to the posterior aspect of her head. EXAM: CT HEAD WITHOUT CONTRAST CT CERVICAL SPINE WITHOUT CONTRAST TECHNIQUE: Multidetector CT imaging of the head and cervical spine was performed following the standard protocol without intravenous contrast. Multiplanar CT image reconstructions of the cervical spine were also generated. RADIATION DOSE REDUCTION: This exam was performed according to the departmental dose-optimization program which includes automated exposure control, adjustment of the mA and/or kV according to patient size and/or use of iterative reconstruction technique. COMPARISON:  None Available. FINDINGS: CT HEAD FINDINGS Brain: No evidence of large-territorial acute infarction. No definite parenchymal hemorrhage. No mass lesion. Acute moderate volume right frontal subarachnoid hemorrhage. No mass effect or midline shift. No hydrocephalus. Basilar cisterns are patent. Vascular: No hyperdense vessel. Atherosclerotic calcifications are present within the cavernous internal carotid and vertebral arteries. Skull: No acute fracture or focal lesion. Sinuses/Orbits: Paranasal sinuses and mastoid air cells are clear. Bilateral lens replacement. Otherwise the orbits are unremarkable. Other: A 1.7 cm left occipital scalp acute hematoma formation. Associated foci of gas suggestive of overlying laceration/soft tissue defect. CT CERVICAL SPINE FINDINGS Alignment: Normal. Skull base and vertebrae: Multilevel severe degenerative changes of the spine most prominent at the C3 through C6 levels.  Associated severe right C3-C4 osseous neural foraminal stenosis. No severe osseous central canal stenosis. Soft tissues and spinal canal: No prevertebral fluid or swelling. No visible canal hematoma. Upper chest: Biapical pleural/pulmonary scarring. Other: None. IMPRESSION: 1. Acute right frontal subarachnoid hemorrhage with underlying parenchymal hemorrhage not excluded. Recommend follow-up CT head to evaluate for a blooming parenchymal contusion. 2. No acute displaced fracture or traumatic listhesis of the cervical spine. 3. A 1.7 cm left occipital scalp acute hematoma formation. 4. Degenerative changes of the spine with associated severe right C3-C4 osseous neural foraminal stenosis. Electronically Signed: By: Iven Finn M.D. On: 08/17/2022 00:17      Labs: BNP (last 3 results) No results for input(s): "BNP" in the last 8760 hours. Basic Metabolic Panel: Recent Labs  Lab 08/17/22 0058  NA 129*  K 3.3*  CL 93*  CO2 26  GLUCOSE 118*  BUN 16  CREATININE 0.97  CALCIUM 9.0   Liver Function Tests: Recent Labs  Lab 08/17/22 0058  AST 35  ALT 35  ALKPHOS 69  BILITOT 0.9  PROT 6.4*  ALBUMIN 3.9   No results for input(s): "LIPASE", "AMYLASE" in the last 168 hours. No results for input(s): "AMMONIA" in the last 168 hours. CBC: Recent Labs  Lab 08/17/22 0058 08/17/22 0407  WBC 8.4  --   NEUTROABS 7.0  --   HGB 11.1* 9.5*  HCT 33.2*  28.5*  MCV 88.8  --   PLT 206  --    Cardiac Enzymes: No results for input(s): "CKTOTAL", "CKMB", "CKMBINDEX", "TROPONINI" in the last 168 hours. BNP: Invalid input(s): "POCBNP" CBG: No results for input(s): "GLUCAP" in the last 168 hours. D-Dimer No results for input(s): "DDIMER" in the last 72 hours. Hgb A1c No results for input(s): "HGBA1C" in the last 72 hours. Lipid Profile No results for input(s): "CHOL", "HDL", "LDLCALC", "TRIG", "CHOLHDL", "LDLDIRECT" in the last 72 hours. Thyroid function studies No results for input(s):  "TSH", "T4TOTAL", "T3FREE", "THYROIDAB" in the last 72 hours.  Invalid input(s): "FREET3" Anemia work up No results for input(s): "VITAMINB12", "FOLATE", "FERRITIN", "TIBC", "IRON", "RETICCTPCT" in the last 72 hours. Urinalysis No results found for: "COLORURINE", "APPEARANCEUR", "LABSPEC", "PHURINE", "GLUCOSEU", "HGBUR", "BILIRUBINUR", "KETONESUR", "PROTEINUR", "UROBILINOGEN", "NITRITE", "LEUKOCYTESUR" Sepsis Labs Recent Labs  Lab 08/17/22 0058  WBC 8.4   Microbiology Recent Results (from the past 240 hour(s))  SARS Coronavirus 2 by RT PCR (hospital order, performed in [facility] hospital lab) *cepheid single result test* Anterior Nasal Swab     Status: None   Collection Time: 08/17/22  2:48 AM   Specimen: Anterior Nasal Swab  Result Value Ref Range Status   SARS Coronavirus 2 by RT PCR NEGATIVE NEGATIVE Final    Comment: (NOTE) SARS-CoV-2 target nucleic acids are NOT DETECTED.  The SARS-CoV-2 RNA is generally detectable in upper and lower respiratory specimens during the acute phase of infection. The lowest concentration of SARS-CoV-2 viral copies this assay can detect is 250 copies / mL. A negative result does not preclude SARS-CoV-2 infection and should not be used as the sole basis for treatment or other patient management decisions.  A negative result may occur with improper specimen collection / handling, submission of specimen other than nasopharyngeal swab, presence of viral mutation(s) within the areas targeted by this assay, and inadequate number of viral copies (<250 copies / mL). A negative result must be combined with clinical observations, patient history, and epidemiological information.  Fact Sheet for Patients:   https://www.patel.info/  Fact Sheet for Healthcare Providers: https://hall.com/  This test is not yet approved or  cleared by the [place] FDA and has been authorized for detection and/or diagnosis of  SARS-CoV-2 by FDA under an Emergency Use Authorization (EUA).  This EUA will remain in effect (meaning this test can be used) for the duration of the COVID-19 declaration under Section 564(b)(1) of the Act, 21 U.S.C. section 360bbb-3(b)(1), unless the authorization is terminated or revoked sooner.  Performed at [facility], [address] [place], [place] [address]      Total time spend on discharging this patient, including the last patient exam, discussing the hospital stay, instructions for ongoing care as it relates to all pertinent caregivers, as well as preparing the medical discharge records, prescriptions, and/or referrals as applicable, is 45 minutes.    Enzo Bi, MD  [facility] 08/17/2022, 1:15 PM

## 2022-08-17 NOTE — Consult Note (Addendum)
Neurology Consultation Reason for Consult: Disequilibrium Referring Physician: Laurette Schimke  CC: Disequilibrium  History is obtained from: Patient, daughter  HPI: Cindy Reed is a 88 y.o. female with a history of feeling dizzy for the past year.  She states that it may have gotten slightly worse over that timeframe.  She reports being seen by an ENT, though I do not see a note in Care Everywhere or epic.  She apparently was referred to neurology as he did not think there was a peripheral etiology, and does remember being moved around in a way that suggests at least a Dix-Hallpike.  She describes a sensation as a feeling of "walking on a boat."  She states that the sensation of dizziness occurs more with positional changes, it can be sitting up or standing up, but also rolling over in bed.  Past Medical History:  Diagnosis Date   Hypertension    Osteoporosis    Scoliosis      Family History  Problem Relation Age of Onset   Breast cancer Neg Hx      Social History:  reports that she has never smoked. She has never used smokeless tobacco. She reports that she does not currently use alcohol. No history on file for drug use.   Exam: Current vital signs: BP 130/63   Pulse 77   Temp 98.6 F (37 C) (Oral)   Resp 15   Ht 5\' 1"  (1.549 m)   Wt 50.4 kg   SpO2 97%   BMI 20.99 kg/m  Vital signs in last 24 hours: Temp:  [97.9 F (36.6 C)-98.6 F (37 C)] 98.6 F (37 C) (09/16 09-17-1994) Pulse Rate:  [70-80] 77 (09/16 0930) Resp:  [12-19] 15 (09/16 0930) BP: (105-159)/(58-86) 130/63 (09/16 0930) SpO2:  [96 %-100 %] 97 % (09/16 0930) Weight:  [50.4 kg] 50.4 kg (09/15 2335)   Physical Exam  Constitutional: Appears well-developed and well-nourished.   Neuro: Mental Status: Patient is awake, alert, oriented to person, place, month, year, and situation. Patient is able to give a clear and coherent history. No signs of aphasia or neglect Cranial Nerves: II: Visual Fields are full. Pupils  are equal, round, and reactive to light.   III,IV, VI: EOMI without nystagmus, but she does have significant saccadic intrusion into smooth pursuit V: Facial sensation is symmetric to temperature VII: Facial movement is symmetric.  VIII: hearing is intact to voice X: Uvula elevates symmetrically XI: Shoulder shrug is symmetric. XII: tongue is midline without atrophy or fasciculations.  Motor: Tone is normal. Bulk is normal. 5/5 strength was present in all four extremities.  Sensory: Sensation is symmetric to light touch and temperature in the arms and legs.  She does have decreased proprioception at the toes, preserved at the ankles  Cerebellar: She has slowed movements on finger-nose-finger bilaterally, without horizontal tremor or past-pointing     I have reviewed labs in epic and the results pertinent to this consultation are: Recent TSH of 12 Mild hyponatremia of 129, this appears chronic  I have reviewed the images obtained: CT head-small hemorrhagic contusion  Impression: 88 year old female with a history of 1 year progressive disequilibrium.  I suspect that this represents a multifactorial gait disorder including mild proprioceptive loss, scoliosis with change of center of gravity, possibly with some contribution of cerebellar dysfunction based on saccadic smooth pursuit, and hypothyroidism can contribute to sensations of disequilibrium as well.  At this time, I would favor PT and treatment of her underlying thyroid issues,  and if symptoms remain can pursue further work-up as an outpatient.  She does take a B12 supplement, but I do not see that this has been checked either here or through [name], I will send this to rule out this is a contributor as well.  Also, though it does not sound like orthostasis, I think it would be worthwhile to check this as well.  Recommendations: 1) PT 2) treatment of hypothyroidism 3) B12 4) orthostatic vital signs  5) outpatient  follow-up   Roland Rack, MD [facility] [phone]  If 7pm- 7am, please page neurology on call as listed in [place].

## 2022-08-17 NOTE — ED Notes (Signed)
Informed RN bed assigned 

## 2022-08-17 NOTE — TOC Initial Note (Addendum)
Transition of Care [facility]) - Initial/Assessment Note    Patient Details  Name: Cindy Reed MRN: [id number] Date of Birth: [birth year]  Transition of Care [facility]) CM/SW Contact:    Cindy Ivan, LCSW Phone Number: 08/17/2022, 12:33 PM  Clinical Narrative:        Patient to DC from the ED today. Patient on isolation. Spoke with daughter Cindy Reed via phone who is at bedside. Patient lives alone, but her 2 daughters will be staying with her to provide 24 hour care at DC. PCP is Dr. Astrid Divine. Pharmacy is [facility]. Daughters to provide transportation. Patient and family agreeable to RW and [facility]. Confirmed home address.  RW ordered through [facility] to be delivered to bedside prior to DC today. HHPT arranged through [facility] with [facility].          2:38- RW still not delivered. Called Zach Soil scientist) with [employer] to follow up, Thedore Mins is checking on this.  3:00- RW has been delivered.   Expected Discharge Plan: [place] Barriers to Discharge: Barriers Resolved   Patient Goals and CMS Choice Patient states their goals for this hospitalization and ongoing recovery are:: home with home health CMS Medicare.gov Compare Post Acute Care list provided to:: Patient Represenative (must comment) Choice offered to / list presented to : Adult Children  Expected Discharge Plan and Services Expected Discharge Plan: [place] Choice: [place] arrangements for the past 2 months: Single Family Home                 DME Arranged: Walker rolling DME Agency: [employer] Date DME Agency Contacted: 08/17/22   Representative spoke with at DME Agency: Mardene Celeste [facility] Arranged: PT [place]: [place] Date [place]: 08/17/22   Representative spoke with at [place]: Tommi Rumps  Prior Living Arrangements/Services Living arrangements for the past 2 months: [place] Lives with::  Self Patient language and need for interpreter reviewed:: Yes Do you feel safe going back to the place where you live?: Yes      Need for Family Participation in Patient Care: Yes (Comment) Care giver support system in place?: Yes (comment)   Criminal Activity/Legal Involvement Pertinent to Current Situation/Hospitalization: No - Comment as needed  Activities of Daily Living      Permission Sought/Granted Permission sought to share information with : Facility Art therapist granted to share information with : Yes, Verbal Permission Granted (by daughter)     Permission granted to share info w AGENCY: [place] and DME agencies        Emotional Assessment         Alcohol / Substance Use: Not Applicable Psych Involvement: No (comment)  Admission diagnosis:  Traumatic subarachnoid hemorrhage ([place]) [S06.6XAA] Patient Active Problem List   Diagnosis Date Noted   Traumatic subarachnoid hemorrhage ([place]) 08/17/2022   Postural dizziness with presyncope 08/17/2022   Scalp laceration, initial encounter 08/17/2022   Acute blood loss anemia secondary to scalp laceration/hematoma([facility]) 08/17/2022   Hyponatremia 08/17/2022   Hypokalemia 08/17/2022   Hypertension 08/17/2022   Paroxysmal atrial fibrillation ([place]) 08/17/2022   Stage 3a chronic kidney disease ([place]) 08/17/2022   PCP:  [name], MD Pharmacy:   [id number] - Closed - [place], [place] [address] [place] [address] Phone: [phone] Fax: [phone]  [place], [place] -  [address] [place] [address] Phone: [phone] Fax: [phone]     Social Determinants of Health (SDOH) Interventions    Readmission Risk Interventions     No data to display

## 2022-08-17 NOTE — Consult Note (Signed)
Referring Physician:  No referring provider defined for this encounter.  Primary Physician:  Cindy Curry, MD  Chief Complaint:  traumatic subrachnoid hemorrhage.  History of Present Illness: Cindy Reed is a 88 y.o. female who presents with the chief complaint of feeling swimmy headed and fall from standing height yesterday.    She did not lose consciousness.     Patient is currently awake and alert and complains of nausea and pain at the site of the laceration to her scalp.  She was in her usual state of health prior to the fall and denies prior nausea, vomiting, chest pain or shortness of breath or palpitations, abdominal pain, diarrhea or dysuria.    Review of Systems:  A 10 point review of systems is negative, except for the pertinent positives and negatives detailed in the HPI.  Past Medical History: Past Medical History:  Diagnosis Date   Hypertension    Osteoporosis    Scoliosis     Past Surgical History: Past Surgical History:  Procedure Laterality Date   CATARACT EXTRACTION W/PHACO Left 07/26/2020   Procedure: CATARACT EXTRACTION PHACO AND INTRAOCULAR LENS PLACEMENT (IOC) LEFT MALYUGIN 5.78 01:12.2 8.0%;  Surgeon: Cindy Koyanagi, MD;  Location: [place];  Service: Ophthalmology;  Laterality: Left;   CATARACT EXTRACTION W/PHACO Right 08/16/2020   Procedure: CATARACT EXTRACTION PHACO AND INTRAOCULAR LENS PLACEMENT (IOC) RIGHT MALYUGIN 7.26 01:05.8 11.0%;  Surgeon: Cindy Koyanagi, MD;  Location: [facility];  Service: Ophthalmology;  Laterality: Right;   DILATION AND CURETTAGE OF UTERUS     TONSILLECTOMY     TUBAL LIGATION      Problem List: Patient Active Problem List   Diagnosis Date Noted   Traumatic subarachnoid hemorrhage ([place]) 08/17/2022   Postural dizziness with presyncope 08/17/2022   Scalp laceration, initial encounter 08/17/2022   Acute blood loss anemia secondary to scalp laceration/hematoma([facility]) 08/17/2022    Hyponatremia 08/17/2022   Hypokalemia 08/17/2022   Hypertension 08/17/2022   Paroxysmal atrial fibrillation ([place]) 08/17/2022   Stage 3a chronic kidney disease ([employer]) 08/17/2022    Allergies: Allergies as of 08/16/2022   (No Known Allergies)    Medications: See chart Social History: Social History   Tobacco Use   Smoking status: Never   Smokeless tobacco: Never  Vaping Use   Vaping Use: Never used  Substance Use Topics   Alcohol use: Not Currently    Family Medical History: Family History  Problem Relation Age of Onset   Breast cancer Neg Hx     Physical Examination: @VITALWITHPAIN @  General: Patient is well developed, well nourished, calm, collected, and in no apparent distress.  Psychiatric: Patient is non-anxious.  Head:  Pupils equal, round, and reactive to light.  ENT:  Oral mucosa appears well hydrated.  Neck:   Supple.  Full range of motion.  Respiratory: Patient is breathing without any difficulty.  Extremities: No edema.  Vascular: Palpable pulses.  Skin:   On exposed skin, there are no abnormal skin lesions.  NEUROLOGICAL:  General: In no acute distress.   Awake, alert, oriented to person, place, and time.  Pupils equal round and reactive to light.  Facial tone is symmetric.  Tongue protrusion is midline.  There is no pronator drift.    Strength: Follow commands x 4 with no gross weakness.   Reflexes are not tested Gait is not tested.  No difficulty with tandem gait.  Hoffman's is absent.  Imaging: 1. Anterior right frontal lobe hemorrhagic contusion and small volume adjacent extra-axial  blood has not significantly changed since 0007 hours today. Mild regional edema but no associated mass effect. 2. No new intracranial abnormality. 3. Left posterior scalp hematoma and laceration has mildly regressed. No underlying skull fracture identified.  I have personally reviewed the images and agree with the above interpretation.  Assessment and  Plan: Cindy Reed is a pleasant 88 y.o. female with small traumatic subrachnoid hemorrhage.  I have discussed the condition with the patient, including showing the radiographs and discussing treatment options in layman's terms.  The patient may benefit from conservative management.  Thus, I have recommended the following:   1)  No acute neurosurgical intervention warranted.   2)  Keep off ASA for 1 week.   3) No neurosurgical follow up needed.   Thank you for involving me in the care of this patient. I will keep you apprised of the patient's progress.   This note was partially dictated using voice recognition software, so please excuse any errors that were not corrected.   Cindy Gainer MD, PhD

## 2022-08-17 NOTE — ED Notes (Signed)
Called informed bed control patient to be d/c

## 2022-08-17 NOTE — ED Notes (Signed)
Orthostatics ordered. Pt unable to complete d/t dizziness. MD Billie Ruddy aware.

## 2022-08-17 NOTE — Evaluation (Signed)
Physical Therapy Evaluation Patient Details Name: Cindy Reed MRN: [id number] DOB: [birth year] Today's Date: 08/17/2022  History of Present Illness  presented to ER secondary to fall (striking head) in home environment; admitted for management of traumatic R frontal SAH (imaging stable) and posterior head lac.  Clinical Impression  Patient resting in bed upon arrival to room; daughter present at bedside.  Patient alert and oriented, follows commands and agreeable to participation with session.  Denies specific pain, endorsing generalized 'soreness' post-fall.  Vitals stable and WFL; orthostatics negative.  No dizziness reported with mobility efforts throughout session.  Bilat UE/LE strength and ROM grossly symmetrical and WFL; no focal weakness, sensory or coordination deficit appreciated.  Able to complete bed mobility with min assist; sit/stand, basic transfers and gait (40') with RW, cga/min assist.  Demonstrates forward trunk flexion with downward gaze; reciprocal stepping pattern, but short shuffling steps with limited heel strike/toe off.  Excessive L LE genu valgus noted thorughout gait cycle (baseline arthric changes per patient).  Limited balance reactions evident (relies heavily on LE step strategy for balance recovery). Do recommend continued use of RW and +1 assist with all mobility at this time Would benefit from skilled PT to address above deficits and promote optimal return to PLOF..; Recommend transition to HHPT upon discharge from acute hospitalization.  Would benefit from 24/7 sup/assist at initial discharge to optimize safe transition; daughter at bedside willing and available to provide.         Recommendations for follow up therapy are one component of a multi-disciplinary discharge planning process, led by the attending physician.  Recommendations may be updated based on patient status, additional functional criteria and insurance authorization.  Follow Up Recommendations Home  health PT (family available and willing to provide 24/7 initially for safe transition home)      Assistance Recommended at Discharge Intermittent Supervision/Assistance  Patient can return home with the following  A little help with walking and/or transfers;A little help with bathing/dressing/bathroom;Help with stairs or ramp for entrance    Equipment Recommendations Rolling walker (2 wheels)  Recommendations for Other Services       Functional Status Assessment Patient has had a recent decline in their functional status and demonstrates the ability to make significant improvements in function in a reasonable and predictable amount of time.     Precautions / Restrictions Precautions Precautions: Fall Restrictions Weight Bearing Restrictions: No      Mobility  Bed Mobility Overal bed mobility: Needs Assistance Bed Mobility: Supine to Sit     Supine to sit: Min assist          Transfers Overall transfer level: Needs assistance Equipment used: Rolling walker (2 wheels) Transfers: Sit to/from Stand Sit to Stand: Min guard, Min assist           General transfer comment: cuing for hand placement to prevent pulling on RW    Ambulation/Gait Ambulation/Gait assistance: Min guard, Min assist Gait Distance (Feet): 40 Feet Assistive device: Rolling walker (2 wheels)         General Gait Details: forward trunk flexion with downward gaze; reciprocal stepping pattern, but short shuffling steps with limited heel strike/toe off.  Excessive L LE genu valgus noted thorughout gait cycle (baseline arthric changes per patient).  Limited balance reactions evident (relies heavily on LE step strategy for balance recovery). Do recommend continued use of RW and +1 assist with all mobility at this time  Stairs  Wheelchair Mobility    Modified Rankin (Stroke Patients Only)       Balance Overall balance assessment: Needs assistance Sitting-balance support: No  upper extremity supported, Feet supported Sitting balance-Leahy Scale: Good     Standing balance support: Bilateral upper extremity supported Standing balance-Leahy Scale: Fair                               Pertinent Vitals/Pain Pain Assessment Pain Assessment: No/denies pain    Home Living Family/patient expects to be discharged to:: Private residence Living Arrangements: Alone Available Help at Discharge: Family;Available PRN/intermittently Type of Home: House Home Access: Stairs to enter Entrance Stairs-Rails: Left Entrance Stairs-Number of Steps: 3-4 Alternate Level Stairs-Number of Steps: 5 Home Layout: Multi-level Home Equipment: Agricultural consultant (2 wheels);Cane - single point      Prior Function Prior Level of Function : Independent/Modified Independent             Mobility Comments: Mod indep with SPC for ADLs, household and limited community mobilization; does endorse at least 2 falls in previous six months; no driving (daughters assist with groceries and errands)       Hand Dominance   Dominant Hand: Right    Extremity/Trunk Assessment   Upper Extremity Assessment Upper Extremity Assessment: Overall WFL for tasks assessed (grossly 4/5 throughout; no focal weakness, sensory or coordination deficits appreciated)    Lower Extremity Assessment Lower Extremity Assessment: Overall WFL for tasks assessed (grossly 4/5 throughout; no focal weakness, sensory or coordination deficits appreciated)       Communication   Communication: HOH  Cognition Arousal/Alertness: Awake/alert Behavior During Therapy: WFL for tasks assessed/performed Overall Cognitive Status: Within Functional Limits for tasks assessed                                          General Comments      Exercises     Assessment/Plan    PT Assessment Patient needs continued PT services  PT Problem List Decreased activity tolerance;Decreased balance;Decreased  mobility;Decreased coordination;Decreased knowledge of use of DME;Decreased safety awareness;Decreased knowledge of precautions       PT Treatment Interventions DME instruction;Gait training;Functional mobility training;Stair training;Therapeutic activities;Therapeutic exercise;Balance training;Patient/family education;Neuromuscular re-education    PT Goals (Current goals can be found in the Care Plan section)  Acute Rehab PT Goals Patient Stated Goal: to go home PT Goal Formulation: With patient/family Time For Goal Achievement: 08/31/22 Potential to Achieve Goals: Good    Frequency 7X/week     Co-evaluation               AM-PAC PT "6 Clicks" Mobility  Outcome Measure Help needed turning from your back to your side while in a flat bed without using bedrails?: None Help needed moving from lying on your back to sitting on the side of a flat bed without using bedrails?: A Little Help needed moving to and from a bed to a chair (including a wheelchair)?: A Little Help needed standing up from a chair using your arms (e.g., wheelchair or bedside chair)?: A Little Help needed to walk in hospital room?: A Little Help needed climbing 3-5 steps with a railing? : A Little 6 Click Score: 19    End of Session Equipment Utilized During Treatment: Gait belt Activity Tolerance: Patient tolerated treatment well Patient left: in bed;with call bell/phone within  reach;with family/visitor present Nurse Communication: Mobility status PT Visit Diagnosis: Muscle weakness (generalized) (M62.81);Difficulty in walking, not elsewhere classified (R26.2);Other symptoms and signs involving the nervous system (V61.537)    Time: 9432-7614 PT Time Calculation (min) (ACUTE ONLY): 26 min   Charges:   PT Evaluation $PT Eval Moderate Complexity: 1 Mod          Larya Charpentier H. Owens Shark, PT, DPT, NCS 08/17/22, 12:10 PM [phone]

## 2022-08-17 NOTE — Assessment & Plan Note (Signed)
Renal function at baseline 

## 2022-08-17 NOTE — Assessment & Plan Note (Signed)
Secondary to thiazide diuretic Oral potassium x1 dose

## 2022-08-17 NOTE — Assessment & Plan Note (Addendum)
NS bolus given in the ED.  We will continue NS infusion

## 2022-08-17 NOTE — Assessment & Plan Note (Addendum)
Hemoglobin 11.1>9.5, down from 13 on 07/29/22. Secondary to bleeding from scalp wound which appears to have stopped with pressure dressing and laceration repair.  Could be partly hemo dilutional as patient received a 1 L fluid bolus in the ED Continue to trend H&H Type and cross transfuse if necessary SCD for DVT prophylaxis    Latest Ref Rng & Units 08/17/2022    4:07 AM 08/17/2022   12:58 AM  CBC  WBC 4.0 - 10.5 K/uL  8.4   Hemoglobin 12.0 - 15.0 g/dL 9.5  11.1   Hematocrit 36.0 - 46.0 % 28.5  33.2   Platelets 150 - 400 K/uL  206

## 2022-08-17 NOTE — H&P (Addendum)
History and Physical    Patient: Cindy Reed GGY:[id number] DOB: 1931-02-02 DOA: 08/17/2022 DOS: the patient was seen and examined on 08/17/2022 PCP: [name], MD  Patient coming from: Home  Chief Complaint:  Chief Complaint  Patient presents with   Fall    HPI: Cindy Reed is a 88 y.o. female with medical history significant for atrial fibrillation diagnosed in April 2023 on amiodarone and aspirin, (self discontinued Eliquis early July due to easy bruising), as well as history of pulmonary hypertension,  vertigo, CKD 3a, who presents to the ED following a fall.  Patient has a history of vertigo and often feels "swimmy headed" and as she stood up, immediately felt unsteady, falling backwards hitting her head sustaining a laceration to the top of her head.  She did not lose consciousness.  History is provided in part by her daughter at the bedside who stated that she was seen by ENT for evaluation of her inner ear and she was subsequently referred to a neurologist but never made it there as the appointment was several months out.  Patient is currently awake and alert and complains of nausea and pain at the site of the laceration to her scalp.  She was in her usual state of health prior to the fall and denies prior nausea, vomiting, chest pain or shortness of breath or palpitations, abdominal pain, diarrhea or dysuria. ED course and data review: BP 142/64 with otherwise normal vitals.  Pertinent labs include sodium 129 and potassium 3.3.  Hemoglobin 11.1 down from 13 a couple weeks prior.  EKG personally viewed and interpreted showing NSR with no acute ST-T wave changes.   CT head showed left frontal subarachnoid hemorrhage.  Repeat CT head 2 hours later indicated after patient had an episode of emesis showed stable findings. The ED provider spoke with neurosurgeon on-call who recommended repeat CT head in 6 hours. Patient had laceration repair in the ED with application of a pressure dressing  and administration of tetanus shot.  Hospitalist consulted for admission.   Review of Systems: As mentioned in the history of present illness. All other systems reviewed and are negative.  Past Medical History:  Diagnosis Date   Hypertension    Osteoporosis    Scoliosis    Past Surgical History:  Procedure Laterality Date   CATARACT EXTRACTION W/PHACO Left 07/26/2020   Procedure: CATARACT EXTRACTION PHACO AND INTRAOCULAR LENS PLACEMENT (IOC) LEFT MALYUGIN 5.78 01:12.2 8.0%;  Surgeon: Leandrew Koyanagi, MD;  Location: [place];  Service: Ophthalmology;  Laterality: Left;   CATARACT EXTRACTION W/PHACO Right 08/16/2020   Procedure: CATARACT EXTRACTION PHACO AND INTRAOCULAR LENS PLACEMENT (IOC) RIGHT MALYUGIN 7.26 01:05.8 11.0%;  Surgeon: Leandrew Koyanagi, MD;  Location: [place];  Service: Ophthalmology;  Laterality: Right;   DILATION AND CURETTAGE OF UTERUS     TONSILLECTOMY     TUBAL LIGATION     Social History:  reports that she has never smoked. She has never used smokeless tobacco. She reports that she does not currently use alcohol. No history on file for drug use.  No Known Allergies  Family History  Problem Relation Age of Onset   Breast cancer Neg Hx     Prior to Admission medications   Medication Sig Start Date End Date Taking? Authorizing Provider  amiodarone (PACERONE) 200 MG tablet Take 200 mg by mouth daily. 07/31/22   [provider]  aspirin EC 81 MG tablet Take 81 mg by mouth daily. 07/29/22 07/29/23  [provider]  Calcium Carb-Cholecalciferol (CALCIUM 600 + D PO) Take by mouth in the morning and at bedtime.    [provider]  denosumab (PROLIA) 60 MG/ML SOSY injection Inject 60 mg into the skin every 6 (six) months.    [provider]  FLUoxetine (PROZAC) 10 MG tablet Take 10 mg by mouth daily. Patient not taking: Reported on 08/17/2022    [provider]  FLUoxetine (PROZAC) 20 MG capsule Take  20 mg by mouth daily. 07/29/22   [provider]  hydrochlorothiazide (HYDRODIURIL) 12.5 MG tablet Take 12.5 mg by mouth daily. 07/30/22   [provider]  levothyroxine (SYNTHROID) 25 MCG tablet Take 25 mcg by mouth daily. 07/30/22   [provider]  potassium chloride (KLOR-CON M) 10 MEQ tablet Take 10 mEq by mouth daily. 07/09/22   [provider]  triamterene-hydrochlorothiazide (MAXZIDE-25) 37.5-25 MG tablet Take 1 tablet by mouth daily.    [provider]  Vitamin D3 (VITAMIN D) 25 MCG tablet Take 1,000 Units by mouth daily.    [provider]    Physical Exam: Vitals:   08/17/22 0300 08/17/22 0330 08/17/22 0400 08/17/22 0430  BP: (!) 147/71 137/81 106/62 (!) 123/58  Pulse: 79 80 76 80  Resp: 18 18 17 12   Temp:      TempSrc:      SpO2: 97% 97% 96% 100%  Weight:      Height:       Physical Exam Vitals and nursing note reviewed.  Constitutional:      General: She is not in acute distress. HENT:     Head: Normocephalic and atraumatic.     Comments: Bandage on head Cardiovascular:     Rate and Rhythm: Normal rate and regular rhythm.     Heart sounds: Normal heart sounds.  Pulmonary:     Effort: Pulmonary effort is normal.     Breath sounds: Normal breath sounds.  Abdominal:     Palpations: Abdomen is soft.     Tenderness: There is no abdominal tenderness.  Neurological:     General: No focal deficit present.     Labs on Admission: I have personally reviewed following labs and imaging studies  CBC: Recent Labs  Lab 08/17/22 0058 08/17/22 0407  WBC 8.4  --   NEUTROABS 7.0  --   HGB 11.1* 9.5*  HCT 33.2* 28.5*  MCV 88.8  --   PLT 206  --    Basic Metabolic Panel: Recent Labs  Lab 08/17/22 0058  NA 129*  K 3.3*  CL 93*  CO2 26  GLUCOSE 118*  BUN 16  CREATININE 0.97  CALCIUM 9.0   GFR: Estimated Creatinine Clearance: 29.7 mL/min (by C-G formula based on SCr of 0.97 mg/dL). Liver Function Tests: Recent  Labs  Lab 08/17/22 0058  AST 35  ALT 35  ALKPHOS 69  BILITOT 0.9  PROT 6.4*  ALBUMIN 3.9   No results for input(s): "LIPASE", "AMYLASE" in the last 168 hours. No results for input(s): "AMMONIA" in the last 168 hours. Coagulation Profile: Recent Labs  Lab 08/17/22 0058  INR 1.1   Cardiac Enzymes: No results for input(s): "CKTOTAL", "CKMB", "CKMBINDEX", "TROPONINI" in the last 168 hours. BNP (last 3 results) No results for input(s): "PROBNP" in the last 8760 hours. HbA1C: No results for input(s): "HGBA1C" in the last 72 hours. CBG: No results for input(s): "GLUCAP" in the last 168 hours. Lipid Profile: No results for input(s): "CHOL", "HDL", "LDLCALC", "TRIG", "  CHOLHDL", "LDLDIRECT" in the last 72 hours. Thyroid Function Tests: No results for input(s): "TSH", "T4TOTAL", "FREET4", "T3FREE", "THYROIDAB" in the last 72 hours. Anemia Panel: No results for input(s): "VITAMINB12", "FOLATE", "FERRITIN", "TIBC", "IRON", "RETICCTPCT" in the last 72 hours. Urine analysis: No results found for: "COLORURINE", "APPEARANCEUR", "LABSPEC", "PHURINE", "GLUCOSEU", "HGBUR", "BILIRUBINUR", "KETONESUR", "PROTEINUR", "UROBILINOGEN", "NITRITE", "LEUKOCYTESUR"  Radiological Exams on Admission: CT HEAD WO CONTRAST (5MM)  Result Date: 08/17/2022 CLINICAL DATA:  Hemorrhage follow-up EXAM: CT HEAD WITHOUT CONTRAST TECHNIQUE: Contiguous axial images were obtained from the base of the skull through the vertex without intravenous contrast. RADIATION DOSE REDUCTION: This exam was performed according to the departmental dose-optimization program which includes automated exposure control, adjustment of the mA and/or kV according to patient size and/or use of iterative reconstruction technique. COMPARISON:  08/17/2022 FINDINGS: Brain: Unchanged area of contrecoup pattern mixed subarachnoid and intraparenchymal hemorrhage at the right frontal pole. There is periventricular hypoattenuation compatible with chronic  microvascular disease. Vascular: ICA atherosclerosis at the skull base. Skull: No skull fracture. Decreased size of left posterior scalp hematoma. Sinuses/Orbits: No acute finding. Other: None. IMPRESSION: Unchanged area of contrecoup pattern mixed subarachnoid and intraparenchymal hemorrhage at the right frontal pole. Electronically Signed   By: Ulyses Jarred M.D.   On: 08/17/2022 03:05   CT HEAD WO CONTRAST (5MM)  Addendum Date: 08/17/2022   ADDENDUM REPORT: 08/17/2022 00:20 ADDENDUM: These results were called by telephone at the time of interpretation on 08/17/2022 at 12:20 am to provider [facility] , who verbally acknowledged these results. Electronically Signed   By: Iven Finn M.D.   On: 08/17/2022 00:20   Result Date: 08/17/2022 CLINICAL DATA:  Head trauma, moderate-severe fall; Neck trauma (Age >= 65y) fall. h complaints of fall at home - landing on the ground hitting her head. Pt has a hematoma to the posterior aspect of her head. EXAM: CT HEAD WITHOUT CONTRAST CT CERVICAL SPINE WITHOUT CONTRAST TECHNIQUE: Multidetector CT imaging of the head and cervical spine was performed following the standard protocol without intravenous contrast. Multiplanar CT image reconstructions of the cervical spine were also generated. RADIATION DOSE REDUCTION: This exam was performed according to the departmental dose-optimization program which includes automated exposure control, adjustment of the mA and/or kV according to patient size and/or use of iterative reconstruction technique. COMPARISON:  None Available. FINDINGS: CT HEAD FINDINGS Brain: No evidence of large-territorial acute infarction. No definite parenchymal hemorrhage. No mass lesion. Acute moderate volume right frontal subarachnoid hemorrhage. No mass effect or midline shift. No hydrocephalus. Basilar cisterns are patent. Vascular: No hyperdense vessel. Atherosclerotic calcifications are present within the cavernous internal carotid and vertebral  arteries. Skull: No acute fracture or focal lesion. Sinuses/Orbits: Paranasal sinuses and mastoid air cells are clear. Bilateral lens replacement. Otherwise the orbits are unremarkable. Other: A 1.7 cm left occipital scalp acute hematoma formation. Associated foci of gas suggestive of overlying laceration/soft tissue defect. CT CERVICAL SPINE FINDINGS Alignment: Normal. Skull base and vertebrae: Multilevel severe degenerative changes of the spine most prominent at the C3 through C6 levels. Associated severe right C3-C4 osseous neural foraminal stenosis. No severe osseous central canal stenosis. Soft tissues and spinal canal: No prevertebral fluid or swelling. No visible canal hematoma. Upper chest: Biapical pleural/pulmonary scarring. Other: None. IMPRESSION: 1. Acute right frontal subarachnoid hemorrhage with underlying parenchymal hemorrhage not excluded. Recommend follow-up CT head to evaluate for a blooming parenchymal contusion. 2. No acute displaced fracture or traumatic listhesis of the cervical spine. 3. A 1.7 cm left occipital scalp acute  hematoma formation. 4. Degenerative changes of the spine with associated severe right C3-C4 osseous neural foraminal stenosis. Electronically Signed: By: Iven Finn M.D. On: 08/17/2022 00:17   CT Cervical Spine Wo Contrast  Addendum Date: 08/17/2022   ADDENDUM REPORT: 08/17/2022 00:20 ADDENDUM: These results were called by telephone at the time of interpretation on 08/17/2022 at 12:20 am to provider [facility] , who verbally acknowledged these results. Electronically Signed   By: Iven Finn M.D.   On: 08/17/2022 00:20   Result Date: 08/17/2022 CLINICAL DATA:  Head trauma, moderate-severe fall; Neck trauma (Age >= 65y) fall. h complaints of fall at home - landing on the ground hitting her head. Pt has a hematoma to the posterior aspect of her head. EXAM: CT HEAD WITHOUT CONTRAST CT CERVICAL SPINE WITHOUT CONTRAST TECHNIQUE: Multidetector CT imaging of the  head and cervical spine was performed following the standard protocol without intravenous contrast. Multiplanar CT image reconstructions of the cervical spine were also generated. RADIATION DOSE REDUCTION: This exam was performed according to the departmental dose-optimization program which includes automated exposure control, adjustment of the mA and/or kV according to patient size and/or use of iterative reconstruction technique. COMPARISON:  None Available. FINDINGS: CT HEAD FINDINGS Brain: No evidence of large-territorial acute infarction. No definite parenchymal hemorrhage. No mass lesion. Acute moderate volume right frontal subarachnoid hemorrhage. No mass effect or midline shift. No hydrocephalus. Basilar cisterns are patent. Vascular: No hyperdense vessel. Atherosclerotic calcifications are present within the cavernous internal carotid and vertebral arteries. Skull: No acute fracture or focal lesion. Sinuses/Orbits: Paranasal sinuses and mastoid air cells are clear. Bilateral lens replacement. Otherwise the orbits are unremarkable. Other: A 1.7 cm left occipital scalp acute hematoma formation. Associated foci of gas suggestive of overlying laceration/soft tissue defect. CT CERVICAL SPINE FINDINGS Alignment: Normal. Skull base and vertebrae: Multilevel severe degenerative changes of the spine most prominent at the C3 through C6 levels. Associated severe right C3-C4 osseous neural foraminal stenosis. No severe osseous central canal stenosis. Soft tissues and spinal canal: No prevertebral fluid or swelling. No visible canal hematoma. Upper chest: Biapical pleural/pulmonary scarring. Other: None. IMPRESSION: 1. Acute right frontal subarachnoid hemorrhage with underlying parenchymal hemorrhage not excluded. Recommend follow-up CT head to evaluate for a blooming parenchymal contusion. 2. No acute displaced fracture or traumatic listhesis of the cervical spine. 3. A 1.7 cm left occipital scalp acute hematoma  formation. 4. Degenerative changes of the spine with associated severe right C3-C4 osseous neural foraminal stenosis. Electronically Signed: By: Iven Finn M.D. On: 08/17/2022 00:17     Data Reviewed: Relevant notes from primary care and specialist visits, past discharge summaries as available in EHR, including Care Everywhere. Prior diagnostic testing as pertinent to current admission diagnoses Updated medications and problem lists for reconciliation ED course, including vitals, labs, imaging, treatment and response to treatment Triage notes, nursing and pharmacy notes and ED provider's notes Notable results as noted in HPI   Assessment and Plan: * Traumatic subarachnoid hemorrhage ([place]) Follow-up repeat CT head for stability Neurochecks Neurosurgery consult if any change otherwise outpatient follow-up with neurosurgery  Hyponatremia NS bolus given in the ED.  We will continue NS infusion  Acute blood loss anemia secondary to scalp laceration/hematoma(ABLA) Hemoglobin 11.1>9.5, down from 13 on 07/29/22. Secondary to bleeding from scalp wound which appears to have stopped with pressure dressing and laceration repair.  Could be partly hemo dilutional as patient received a 1 L fluid bolus in the ED Continue to trend H&H Type and  cross transfuse if necessary SCD for DVT prophylaxis    Latest Ref Rng & Units 08/17/2022    4:07 AM 08/17/2022   12:58 AM  CBC  WBC 4.0 - 10.5 K/uL  8.4   Hemoglobin 12.0 - 15.0 g/dL 9.5  11.1   Hematocrit 36.0 - 46.0 % 28.5  33.2   Platelets 150 - 400 K/uL  206      Postural dizziness with presyncope Patient had new diagnosis of A-fib in April and had echocardiogram and Holter monitor at that time Has been seen by ENT, per daughter and a referral was given to neurology Neurology consulted  Will place patient on telemetry  PT evaluation Consider discontinuing diuretic antihypertensive Check orthostatics  Hypokalemia Secondary to thiazide  diuretic Oral potassium x1 dose  Scalp laceration, initial encounter Received tetanus shot in the ED Wound care Keep pressure dressing on for now  Stage 3a chronic kidney disease ([place]) Renal function at baseline  Paroxysmal atrial fibrillation (H[facility]) On amiodarone and aspirin Patient self discontinued Eliquis in July 2023        DVT prophylaxis: SCD  Consults: Neurology,   Advance Care Planning: full code   Family Communication: Daughter.  Plan of care discussed in detail and they voiced understanding and agreement of plan  Disposition Plan: Back to previous home environment  Severity of Illness: The appropriate patient status for this patient is OBSERVATION. Observation status is judged to be reasonable and necessary in order to provide the required intensity of service to ensure the patient's safety. The patient's presenting symptoms, physical exam findings, and initial radiographic and laboratory data in the context of their medical condition is felt to place them at decreased risk for further clinical deterioration. Furthermore, it is anticipated that the patient will be medically stable for discharge from the hospital within 2 midnights of admission.   Author: Athena Masse, MD 08/17/2022 4:48 AM  For on call review [email or web address].

## 2022-08-17 NOTE — ED Provider Notes (Addendum)
[facility] Provider Note    Event Date/Time   First MD Initiated Contact with Patient 08/17/22 0021     (approximate)   History   Fall   HPI  Cindy Reed is a 88 y.o. female past medical history of hypertension, osteoporosis who presents after a fall.  Patient stood up today and was feeling unsteady on her feet.  [place] backwards hitting her head.  Did not lose consciousness.  She notes that she frequently has unsteady on her feet.  Denies any focal numbness tingling weakness.  Does have a headache.  Denies nausea vomiting.  She is not on blood thinners.  Denies other pain elsewhere.  She lives alone.  Has been eating and drinking well denies urinary symptoms fevers chills cough shortness of breath or chest pain.     Past Medical History:  Diagnosis Date   Hypertension    Osteoporosis    Scoliosis     There are no problems to display for this patient.    Physical Exam  Triage Vital Signs: ED Triage Vitals  Enc Vitals Group     BP 08/16/22 2334 (!) 159/86     Pulse Rate 08/16/22 2334 75     Resp 08/16/22 2334 18     Temp 08/16/22 2334 97.9 F (36.6 C)     Temp Source 08/16/22 2334 Oral     SpO2 08/16/22 2334 98 %     Weight 08/16/22 2335 111 lb 1.8 oz (50.4 kg)     Height 08/16/22 2335 5\' 1"  (1.549 m)     Head Circumference --      Peak Flow --      Pain Score --      Pain Loc --      Pain Edu? --      Excl. in [place]? --     Most recent vital signs: Vitals:   08/17/22 0230 08/17/22 0300  BP: (!) 142/64 (!) 147/71  Pulse: 73 79  Resp: 13 18  Temp:    SpO2: 97% 97%     General: Awake, no distress.  CV:  Good peripheral perfusion.  Resp:  Normal effort.  Abd:  No distention.  Neuro:             Awake, Alert, Oriented x 3  Other:  Patient has an irregular circular laceration on the posterior occiput with a large amount of hematoma and active oozing with a good amount of blood on her hair   ED Results / Procedures / Treatments   Labs (all labs ordered are listed, but only abnormal results are displayed) Labs Reviewed  COMPREHENSIVE METABOLIC PANEL - Abnormal; Notable for the following components:      Result Value   Sodium 129 (*)    Potassium 3.3 (*)    Chloride 93 (*)    Glucose, Bld 118 (*)    Total Protein 6.4 (*)    GFR, Estimated 56 (*)    All other components within normal limits  CBC WITH DIFFERENTIAL/PLATELET - Abnormal; Notable for the following components:   RBC 3.74 (*)    Hemoglobin 11.1 (*)    HCT 33.2 (*)    Lymphs Abs 0.5 (*)    All other components within normal limits  SARS CORONAVIRUS 2 BY RT PCR  PROTIME-INR  APTT  URINALYSIS, ROUTINE W REFLEX MICROSCOPIC     EKG  EKG shows normal sinus rhythm with a normal axis premature atrial complexes there is ST  depression in multiple leads   RADIOLOGY I reviewed and interpreted the CT of the head which shows a frontal subarachnoid hemorrhage   PROCEDURES:  Critical Care performed: Yes, see critical care procedure note(s)  .Critical Care  Performed by: Georga Hacking, MD Authorized by: Georga Hacking, MD   Critical care provider statement:    Critical care time (minutes):  30   Critical care was time spent personally by me on the following activities:  Development of treatment plan with patient or surrogate, discussions with consultants, evaluation of patient's response to treatment, examination of patient, ordering and review of laboratory studies, ordering and review of radiographic studies, ordering and performing treatments and interventions, pulse oximetry, re-evaluation of patient's condition and review of old charts .Marland KitchenLaceration Repair  Date/Time: 08/17/2022 3:17 AM  Performed by: Georga Hacking, MD Authorized by: Georga Hacking, MD   Consent:    Consent obtained:  Verbal   Risks discussed:  Pain Universal protocol:    Patient identity confirmed:  Verbally with patient Laceration details:    Location:   Scalp   Scalp location:  Occipital   Length (cm):  5 Exploration:    Limited defect created (wound extended): no     Hemostasis achieved with:  Direct pressure and epinephrine   Contaminated: no   Treatment:    Area cleansed with:  Saline   Amount of cleaning:  Extensive   Irrigation solution:  Sterile water   Irrigation method:  Syringe   Visualized foreign bodies/material removed: no     Debridement:  None   Undermining:  None   Scar revision: no   Skin repair:    Repair method:  Sutures   Suture size:  4-0   Suture material:  Nylon   Suture technique:  Simple interrupted and figure eight   Number of sutures:  6 Approximation:    Approximation:  Close Repair type:    Repair type:  Complex Post-procedure details:    Procedure completion:  Tolerated Comments:     Patient with posterior occipital hematoma with laceration.  There was only brisk nonpulsatile oozing coming from the hematoma.  I evacuated small amount of the hematoma and use pressure.  I then sutured closed the irregular circular laceration to allow for hemostasis.  A pressure dressing was applied.   The patient is on the cardiac monitor to evaluate for evidence of arrhythmia and/or significant heart rate changes.   MEDICATIONS ORDERED IN ED: Medications  Tdap (BOOSTRIX) injection 0.5 mL (0.5 mLs Intramuscular Given 08/17/22 0231)  lidocaine-EPINEPHrine (XYLOCAINE W/EPI) 2 %-1:100000 (with pres) injection 20 mL (20 mLs Intradermal Given by Other 08/17/22 0233)  sodium chloride 0.9 % bolus 1,000 mL (1,000 mLs Intravenous New Bag/Given 08/17/22 0232)  ondansetron (ZOFRAN) injection 4 mg (4 mg Intravenous Given 08/17/22 0232)     IMPRESSION / MDM / ASSESSMENT AND PLAN / ED COURSE  I reviewed the triage vital signs and the nursing notes.                              Patient's presentation is most consistent with acute presentation with potential threat to life or bodily function.  Differential diagnosis  includes, but is not limited to, skull fracture, intracranial hemorrhage, concussion, cervical spine fracture  The patient is an 88 year old female presents after a fall likely from presyncope..  She stood up and was lightheaded and unsteady on her feet.  Tells me  that she frequently feels lightheaded upon standing.  Denies other symptoms no recent illnesses nausea vomiting fevers or chills.  She did hit her head.  She is not anticoagulated.  Patient has a large amount of blood in her head as well as a large hematoma.  I copiously irrigated the scalp which revealed a circular irregular shaped laceration with a large amount of underlying hematoma with active oozing but no arterial hemorrhage.  I cleared some of the hematoma and closed the skin with resolution of most of the oozing.  There was still slight ooze but patient was placed in a pressure dressing with Coban and an Ace wrap.  CT of the head shows a frontal subarachnoid hemorrhage that is traumatic.  Discussed with neurosurgery recommends repeat CT head in 6 hours.  Patient feels lightheaded with sitting up in bed and she is nauseated.  With the new nausea and vomiting will obtain CT head now 2 and half hours after for CT.  Patient will require admission as she is presyncopal with just sitting up in bed.  I think she likely related to the TBI and potentially the underlying etiology of the fall today.  His labs show mild hyponatremia sodium 129, hemoglobin is 11.1.  Potassium mildly low at 3.3.   Patient's repeat CT head is unchanged.  I have sent Dr. Adriana Simas a secure chat message letting him know that the patient will be admitted and that the CT head is unchanged.  I have ordered the 6-hour CT head as well.   FINAL CLINICAL IMPRESSION(S) / ED DIAGNOSES   Final diagnoses:  Traumatic subarachnoid hemorrhage without loss of consciousness, initial encounter (HCC)  Postural dizziness with presyncope     Rx / DC Orders   ED Discharge Orders      None        Note:  This document was prepared using Dragon voice recognition software and may include unintentional dictation errors.   Georga Hacking, MD 08/17/22 0236    Georga Hacking, MD 08/17/22 [phone]

## 2022-08-17 NOTE — Assessment & Plan Note (Addendum)
Patient had new diagnosis of A-fib in April and had echocardiogram and Holter monitor at that time Has been seen by ENT, per daughter and a referral was given to neurology Neurology consulted  Will place patient on telemetry  PT evaluation Consider discontinuing diuretic antihypertensive Check orthostatics

## 2022-08-17 NOTE — Assessment & Plan Note (Signed)
Received tetanus shot in the ED Wound care Keep pressure dressing on for now

## 2022-08-17 NOTE — Assessment & Plan Note (Signed)
On amiodarone and aspirin Patient self discontinued Eliquis in July 2023

## 2022-08-17 NOTE — Assessment & Plan Note (Signed)
Follow-up repeat CT head for stability Neurochecks Neurosurgery consult if any change otherwise outpatient follow-up with neurosurgery

## 2022-09-18 ENCOUNTER — Inpatient Hospital Stay
Admission: EM | Admit: 2022-09-18 | Discharge: 2022-10-02 | DRG: 193 | Disposition: E | Payer: Medicare PPO | Attending: Internal Medicine | Admitting: Internal Medicine

## 2022-09-18 ENCOUNTER — Emergency Department: Payer: Medicare PPO

## 2022-09-18 ENCOUNTER — Other Ambulatory Visit: Payer: Self-pay

## 2022-09-18 DIAGNOSIS — R296 Repeated falls: Secondary | ICD-10-CM | POA: Diagnosis present

## 2022-09-18 DIAGNOSIS — E222 Syndrome of inappropriate secretion of antidiuretic hormone: Secondary | ICD-10-CM | POA: Diagnosis present

## 2022-09-18 DIAGNOSIS — Z79899 Other long term (current) drug therapy: Secondary | ICD-10-CM | POA: Diagnosis not present

## 2022-09-18 DIAGNOSIS — M810 Age-related osteoporosis without current pathological fracture: Secondary | ICD-10-CM | POA: Diagnosis present

## 2022-09-18 DIAGNOSIS — R1110 Vomiting, unspecified: Secondary | ICD-10-CM | POA: Diagnosis not present

## 2022-09-18 DIAGNOSIS — E538 Deficiency of other specified B group vitamins: Secondary | ICD-10-CM | POA: Diagnosis present

## 2022-09-18 DIAGNOSIS — Z515 Encounter for palliative care: Secondary | ICD-10-CM | POA: Diagnosis not present

## 2022-09-18 DIAGNOSIS — F32A Depression, unspecified: Secondary | ICD-10-CM | POA: Diagnosis present

## 2022-09-18 DIAGNOSIS — E039 Hypothyroidism, unspecified: Secondary | ICD-10-CM | POA: Diagnosis present

## 2022-09-18 DIAGNOSIS — Z66 Do not resuscitate: Secondary | ICD-10-CM | POA: Diagnosis not present

## 2022-09-18 DIAGNOSIS — Z1152 Encounter for screening for COVID-19: Secondary | ICD-10-CM | POA: Diagnosis not present

## 2022-09-18 DIAGNOSIS — E876 Hypokalemia: Secondary | ICD-10-CM | POA: Diagnosis not present

## 2022-09-18 DIAGNOSIS — I480 Paroxysmal atrial fibrillation: Secondary | ICD-10-CM | POA: Diagnosis present

## 2022-09-18 DIAGNOSIS — J470 Bronchiectasis with acute lower respiratory infection: Secondary | ICD-10-CM | POA: Diagnosis present

## 2022-09-18 DIAGNOSIS — D519 Vitamin B12 deficiency anemia, unspecified: Secondary | ICD-10-CM | POA: Diagnosis present

## 2022-09-18 DIAGNOSIS — R531 Weakness: Secondary | ICD-10-CM

## 2022-09-18 DIAGNOSIS — M419 Scoliosis, unspecified: Secondary | ICD-10-CM | POA: Diagnosis present

## 2022-09-18 DIAGNOSIS — Z7982 Long term (current) use of aspirin: Secondary | ICD-10-CM

## 2022-09-18 DIAGNOSIS — J9601 Acute respiratory failure with hypoxia: Secondary | ICD-10-CM | POA: Diagnosis present

## 2022-09-18 DIAGNOSIS — S066XAS Traumatic subarachnoid hemorrhage with loss of consciousness status unknown, sequela: Secondary | ICD-10-CM | POA: Diagnosis not present

## 2022-09-18 DIAGNOSIS — W1830XA Fall on same level, unspecified, initial encounter: Secondary | ICD-10-CM | POA: Diagnosis present

## 2022-09-18 DIAGNOSIS — I130 Hypertensive heart and chronic kidney disease with heart failure and stage 1 through stage 4 chronic kidney disease, or unspecified chronic kidney disease: Secondary | ICD-10-CM | POA: Diagnosis present

## 2022-09-18 DIAGNOSIS — I10 Essential (primary) hypertension: Secondary | ICD-10-CM | POA: Diagnosis present

## 2022-09-18 DIAGNOSIS — R0609 Other forms of dyspnea: Secondary | ICD-10-CM | POA: Diagnosis not present

## 2022-09-18 DIAGNOSIS — E871 Hypo-osmolality and hyponatremia: Secondary | ICD-10-CM

## 2022-09-18 DIAGNOSIS — F419 Anxiety disorder, unspecified: Secondary | ICD-10-CM | POA: Diagnosis present

## 2022-09-18 DIAGNOSIS — I5033 Acute on chronic diastolic (congestive) heart failure: Secondary | ICD-10-CM | POA: Diagnosis not present

## 2022-09-18 DIAGNOSIS — N1831 Chronic kidney disease, stage 3a: Secondary | ICD-10-CM | POA: Diagnosis present

## 2022-09-18 DIAGNOSIS — J189 Pneumonia, unspecified organism: Principal | ICD-10-CM | POA: Diagnosis present

## 2022-09-18 DIAGNOSIS — Z79890 Hormone replacement therapy (postmenopausal): Secondary | ICD-10-CM | POA: Diagnosis not present

## 2022-09-18 LAB — COMPREHENSIVE METABOLIC PANEL
ALT: 66 U/L — ABNORMAL HIGH (ref 0–44)
AST: 80 U/L — ABNORMAL HIGH (ref 15–41)
Albumin: 3 g/dL — ABNORMAL LOW (ref 3.5–5.0)
Alkaline Phosphatase: 69 U/L (ref 38–126)
Anion gap: 7 (ref 5–15)
BUN: 14 mg/dL (ref 8–23)
CO2: 25 mmol/L (ref 22–32)
Calcium: 7.5 mg/dL — ABNORMAL LOW (ref 8.9–10.3)
Chloride: 94 mmol/L — ABNORMAL LOW (ref 98–111)
Creatinine, Ser: 1 mg/dL (ref 0.44–1.00)
GFR, Estimated: 54 mL/min — ABNORMAL LOW (ref >60)
Glucose, Bld: 104 mg/dL — ABNORMAL HIGH (ref 70–99)
Potassium: 3.4 mmol/L — ABNORMAL LOW (ref 3.5–5.1)
Sodium: 126 mmol/L — ABNORMAL LOW (ref 135–145)
Total Bilirubin: 1.2 mg/dL (ref 0.3–1.2)
Total Protein: 5.5 g/dL — ABNORMAL LOW (ref 6.5–8.1)

## 2022-09-18 LAB — CBC WITH DIFFERENTIAL/PLATELET
Abs Immature Granulocytes: 0.07 10*3/uL (ref 0.00–0.07)
Basophils Absolute: 0 10*3/uL (ref 0.0–0.1)
Basophils Relative: 0 %
Eosinophils Absolute: 0 10*3/uL (ref 0.0–0.5)
Eosinophils Relative: 0 %
HCT: 27.2 % — ABNORMAL LOW (ref 36.0–46.0)
Hemoglobin: 8.9 g/dL — ABNORMAL LOW (ref 12.0–15.0)
Immature Granulocytes: 1 %
Lymphocytes Relative: 3 %
Lymphs Abs: 0.2 10*3/uL — ABNORMAL LOW (ref 0.7–4.0)
MCH: 29.6 pg (ref 26.0–34.0)
MCHC: 32.7 g/dL (ref 30.0–36.0)
MCV: 90.4 fL (ref 80.0–100.0)
Monocytes Absolute: 1.1 10*3/uL — ABNORMAL HIGH (ref 0.1–1.0)
Monocytes Relative: 11 %
Neutro Abs: 7.9 10*3/uL — ABNORMAL HIGH (ref 1.7–7.7)
Neutrophils Relative %: 85 %
Platelets: 185 10*3/uL (ref 150–400)
RBC: 3.01 MIL/uL — ABNORMAL LOW (ref 3.87–5.11)
RDW: 13.2 % (ref 11.5–15.5)
WBC: 9.3 10*3/uL (ref 4.0–10.5)
nRBC: 0 % (ref 0.0–0.2)

## 2022-09-18 LAB — TSH: TSH: 3.113 u[IU]/mL (ref 0.350–4.500)

## 2022-09-18 LAB — PHOSPHORUS: Phosphorus: 1.7 mg/dL — ABNORMAL LOW (ref 2.5–4.6)

## 2022-09-18 LAB — CK: Total CK: 153 U/L (ref 38–234)

## 2022-09-18 LAB — RESP PANEL BY RT-PCR (FLU A&B, COVID) ARPGX2
Influenza A by PCR: NEGATIVE
Influenza B by PCR: NEGATIVE
SARS Coronavirus 2 by RT PCR: NEGATIVE

## 2022-09-18 LAB — PROCALCITONIN: Procalcitonin: 0.17 ng/mL

## 2022-09-18 LAB — MAGNESIUM: Magnesium: 1.7 mg/dL (ref 1.7–2.4)

## 2022-09-18 MED ORDER — VITAMIN B-12 1000 MCG PO TABS
500.0000 ug | ORAL_TABLET | Freq: Every day | ORAL | Status: DC
  Administered 2022-09-19 – 2022-09-21 (×3): 500 ug via ORAL
  Filled 2022-09-18 (×3): qty 1

## 2022-09-18 MED ORDER — SODIUM CHLORIDE 0.9 % IV SOLN: 500.0000 mg | Freq: Once | INTRAVENOUS | Status: DC

## 2022-09-18 MED ORDER — ONDANSETRON HCL 4 MG PO TABS: 4.0000 mg | ORAL_TABLET | Freq: Four times a day (QID) | ORAL | Status: DC | PRN

## 2022-09-18 MED ORDER — SODIUM CHLORIDE 0.9 % IV SOLN
2.0000 g | INTRAVENOUS | Status: DC
  Administered 2022-09-18 – 2022-09-20 (×3): 2 g via INTRAVENOUS
  Filled 2022-09-18: qty 20
  Filled 2022-09-18 (×2): qty 2

## 2022-09-18 MED ORDER — FLUOXETINE HCL 20 MG PO CAPS
20.0000 mg | ORAL_CAPSULE | Freq: Every day | ORAL | Status: DC
  Administered 2022-09-18 – 2022-09-19 (×2): 20 mg via ORAL
  Filled 2022-09-18 (×2): qty 1

## 2022-09-18 MED ORDER — CYANOCOBALAMIN 1000 MCG/ML IJ SOLN
1000.0000 ug | Freq: Once | INTRAMUSCULAR | Status: DC
  Filled 2022-09-18: qty 1

## 2022-09-18 MED ORDER — HYDROCHLOROTHIAZIDE 12.5 MG PO TABS
12.5000 mg | ORAL_TABLET | Freq: Every day | ORAL | Status: DC
  Administered 2022-09-18: 12.5 mg via ORAL
  Filled 2022-09-18: qty 1

## 2022-09-18 MED ORDER — VITAMIN D 25 MCG (1000 UNIT) PO TABS
1000.0000 [IU] | ORAL_TABLET | Freq: Every day | ORAL | Status: DC
  Administered 2022-09-19 – 2022-09-21 (×3): 1000 [IU] via ORAL
  Filled 2022-09-18 (×3): qty 1

## 2022-09-18 MED ORDER — ONDANSETRON HCL 4 MG/2ML IJ SOLN
4.0000 mg | Freq: Four times a day (QID) | INTRAMUSCULAR | Status: DC | PRN
  Administered 2022-09-20 (×2): 4 mg via INTRAVENOUS
  Filled 2022-09-18 (×3): qty 2

## 2022-09-18 MED ORDER — ACETAMINOPHEN 500 MG PO TABS
1000.0000 mg | ORAL_TABLET | Freq: Once | ORAL | Status: AC
  Administered 2022-09-18: 1000 mg via ORAL
  Filled 2022-09-18: qty 2

## 2022-09-18 MED ORDER — SODIUM CHLORIDE 0.9 % IV SOLN: 2.0000 g | Freq: Once | INTRAVENOUS | Status: DC

## 2022-09-18 MED ORDER — AMIODARONE HCL 200 MG PO TABS
200.0000 mg | ORAL_TABLET | Freq: Every day | ORAL | Status: DC
  Administered 2022-09-18 – 2022-09-21 (×4): 200 mg via ORAL
  Filled 2022-09-18 (×4): qty 1

## 2022-09-18 MED ORDER — ACETAMINOPHEN 650 MG RE SUPP: 650.0000 mg | Freq: Four times a day (QID) | RECTAL | Status: DC | PRN

## 2022-09-18 MED ORDER — ACETAMINOPHEN 325 MG PO TABS
650.0000 mg | ORAL_TABLET | Freq: Four times a day (QID) | ORAL | Status: DC | PRN
  Administered 2022-09-18 – 2022-09-21 (×7): 650 mg via ORAL
  Filled 2022-09-18 (×7): qty 2

## 2022-09-18 MED ORDER — SODIUM CHLORIDE 0.9 % IV BOLUS
1000.0000 mL | Freq: Once | INTRAVENOUS | Status: AC
  Administered 2022-09-18: 1000 mL via INTRAVENOUS

## 2022-09-18 MED ORDER — SODIUM CHLORIDE 0.9 % IV SOLN
500.0000 mg | INTRAVENOUS | Status: DC
  Administered 2022-09-18 – 2022-09-20 (×3): 500 mg via INTRAVENOUS
  Filled 2022-09-18: qty 5
  Filled 2022-09-18 (×2): qty 500

## 2022-09-18 MED ORDER — SENNOSIDES-DOCUSATE SODIUM 8.6-50 MG PO TABS: 1.0000 | ORAL_TABLET | Freq: Every evening | ORAL | Status: DC | PRN

## 2022-09-18 MED ORDER — LEVOTHYROXINE SODIUM 25 MCG PO TABS
25.0000 ug | ORAL_TABLET | Freq: Every day | ORAL | Status: DC
  Administered 2022-09-19: 25 ug via ORAL
  Filled 2022-09-18: qty 1

## 2022-09-18 MED ORDER — ASPIRIN 81 MG PO TBEC
81.0000 mg | DELAYED_RELEASE_TABLET | Freq: Every day | ORAL | Status: DC
  Administered 2022-09-19 – 2022-09-21 (×3): 81 mg via ORAL
  Filled 2022-09-18 (×3): qty 1

## 2022-09-18 NOTE — H&P (Addendum)
History and Physical   Cindy Reed OXB:[id number] DOB: 05-Jul-1931 DOA: 18-Oct-2022  PCP: [name], MD  Outpatient Specialists: Dr. [facility] cardiology Patient coming from: Home via EMS  I have personally briefly reviewed patient's old medical records in [place].  Chief Concern: Fall  HPI: Ms. Cindy Reed is an 88 year old female with history of depression, anxiety, atrial fibrillation on aspirin and amiodarone, hypothyroid, recent history of traumatic subarachnoid hemorrhage, who presents to the emergency department for chief concerns of falling and weakness.  Initial vitals in the emergency department showed temperature of 98.5, respiration rate of 16, heart rate of 79, blood pressure 124/65, SPO2 of 88% on room air and has improved to 92% on 2 L nasal cannula.  Serum sodium is 126, potassium 3.4, chloride 94, bicarb 25, BUN of 14, serum creatinine of 1.00, GFR 54, nonfasting blood glucose 104, WBC 9.3, hemoglobin 8.9, platelets of 185.    CK was 153.  TSH is 3.113.    COVID/influenza A/influenza B PCR were negative.  ED treatment: Azithromycin 500 mg and ceftriaxone 2 g IV.  Sodium chloride 1 L bolus.  At bedside, she is able to tell me her name, age, current calendar year of 1921, and current location of hospital.   She reports that she is slowly collapse against a closet door with her back onto her gluteal.  She denies head trauma.  She denies loss of consciousness.  She reports she just felt very weak.  She denies cough, chest pain, shortness of breath, abdominal pain, fever, nausea, vomiting.  She denies known sick contacts.  ROS: Constitutional: no weight change, no fever ENT/Mouth: no sore throat, no rhinorrhea Eyes: no eye pain, no vision changes Cardiovascular: no chest pain, no dyspnea,  no edema, no palpitations Respiratory: no cough, no sputum, no wheezing Gastrointestinal: no nausea, no vomiting, no diarrhea, no  constipation Genitourinary: no urinary incontinence, no dysuria, no hematuria Musculoskeletal: no arthralgias, no myalgias Skin: no skin lesions, no pruritus, Neuro: + weakness, no loss of consciousness, no syncope Psych: no anxiety, no depression, + decrease appetite Heme/Lymph: no bruising, no bleeding  ED Course: Discussed with emergency medicine provider, patient requiring hospitalization for chief concerns of multifocal pneumonia.  Assessment/Plan  Principal Problem:   Multifocal pneumonia Active Problems:   Hyponatremia   Hypertension   Paroxysmal atrial fibrillation (HCC)   Stage 3a chronic kidney disease (HCC)   B12 deficiency   Frequent falls   Hypothyroidism   Weakness   Assessment and Plan:  * Multifocal pneumonia - Continue with ceftriaxone 2 mg IV and azithromycin 500 mg IV - Procalcitonin on admission for baseline and to assist with assessing antibiotic effectiveness for tomorrow - Check MRSA PCR - Admit to telemetry medical, inpatient  Hyponatremia - Status post sodium chloride 1 L bolus per EDP, therefore no additional sodium chloride by myself - Repeat BMP in the a.m., a.m. team to order more IV fluid as needed - Encourage p.o. intake  Weakness - Check serum B12 level  Hypothyroidism - Levothyroxine 25 mcg daily resumed  Frequent falls - Fall precautions - AM team to consult PT/OT as above  B12 deficiency With B12 deficiency anemia - Serum B12 level on 08/17/2022: 170 - Patient states that she is taking p.o. B12 over-the-counter - We will check serum B12 levels, if deficient would recommend a.m. team to give patient IM injections  Stage 3a chronic kidney disease ([place]) At baseline  Chart reviewed.   DVT prophylaxis:  Code  Status: full code  Diet: Heart healthy Family Communication: Updated Cathy, daughter at bedside with patient's permission Disposition Plan: pending clinical course Consults called: None at this time Admission status:  Telemetry medical, inpatient  Past Medical History:  Diagnosis Date   Hypertension    Osteoporosis    Scoliosis    Past Surgical History:  Procedure Laterality Date   CATARACT EXTRACTION W/PHACO Left 07/26/2020   Procedure: CATARACT EXTRACTION PHACO AND INTRAOCULAR LENS PLACEMENT (IOC) LEFT MALYUGIN 5.78 01:12.2 8.0%;  Surgeon: Leandrew Koyanagi, MD;  Location: [place];  Service: Ophthalmology;  Laterality: Left;   CATARACT EXTRACTION W/PHACO Right 08/16/2020   Procedure: CATARACT EXTRACTION PHACO AND INTRAOCULAR LENS PLACEMENT (IOC) RIGHT MALYUGIN 7.26 01:05.8 11.0%;  Surgeon: Leandrew Koyanagi, MD;  Location: [place];  Service: Ophthalmology;  Laterality: Right;   DILATION AND CURETTAGE OF UTERUS     TONSILLECTOMY     TUBAL LIGATION     Social History:  reports that she has never smoked. She has never used smokeless tobacco. She reports that she does not currently use alcohol. No history on file for drug use.  No Known Allergies Family History  Problem Relation Age of Onset   Breast cancer Neg Hx    Family history: Family history reviewed and not pertinent.  Prior to Admission medications   Medication Sig Start Date End Date Taking? Authorizing Provider  amiodarone (PACERONE) 200 MG tablet Take 200 mg by mouth daily.   Yes [provider]  aspirin EC 81 MG tablet Take 1 tablet (81 mg total) by mouth daily. 08/24/22  Yes Enzo Bi, MD  calcium carbonate (OSCAL) 1500 (600 Ca) MG TABS tablet Take 1 tablet by mouth as directed.   Yes [provider]  cyanocobalamin (VITAMIN B12) 500 MCG tablet Take 500 mcg by mouth daily.   Yes [provider]  denosumab (PROLIA) 60 MG/ML SOSY injection Inject 60 mg into the skin every 6 (six) months.   Yes [provider]  fexofenadine (ALLEGRA) 180 MG tablet Take 180 mg by mouth daily as needed for allergies.   Yes [provider]  FLUoxetine (PROZAC) 20 MG capsule Take 20 mg  by mouth daily.   Yes [provider]  hydrochlorothiazide (HYDRODIURIL) 12.5 MG tablet Take 12.5 mg by mouth daily.   Yes [provider]  levothyroxine (SYNTHROID) 50 MCG tablet Take 50 mcg by mouth daily.   Yes [provider]  Vitamin D3 (VITAMIN D) 25 MCG tablet Take 1,000 Units by mouth daily.   Yes [provider]   Physical Exam: Vitals:   09/29/2022 1554 10/01/2022 1630 09/05/2022 1702 09/22/2022 1800  BP:  (!) 113/57 107/61 114/65  Pulse: 86 80 80 73  Resp: (!) 22 (!) 23 19 20   Temp: (!) 100.5 F (38.1 C)  99.9 F (37.7 C) 98.5 F (36.9 C)  TempSrc: Oral  Oral Oral  SpO2: 98% 94% 93% 97%  Weight:      Height:       Constitutional: appears age-appropriate, NAD, calm, comfortable Eyes: PERRL, lids and conjunctivae normal ENMT: Mucous membranes are moist. Posterior pharynx clear of any exudate or lesions. Age-appropriate dentition. Hearing appropriate Neck: normal, supple, no masses, no thyromegaly Respiratory: clear to auscultation bilaterally, no wheezing, no crackles. Normal respiratory effort. No accessory muscle use.  Cardiovascular: Regular rate and rhythm, no murmurs / rubs / gallops. No extremity edema. 2+ pedal pulses. No carotid bruits.  Abdomen: no tenderness, no masses palpated, no hepatosplenomegaly. Bowel  sounds positive.  Musculoskeletal: no clubbing / cyanosis. No joint deformity upper and lower extremities. Good ROM, no contractures, no atrophy. Normal muscle tone.  Skin: no rashes, lesions, ulcers. No induration Neurologic: Sensation intact. Strength 5/5 in all 4.  Psychiatric: Normal judgment and insight. Alert and oriented x 3. Normal mood.   EKG: independently reviewed, showing sinus rhythm with rate of 78, QTc 465  Chest x-ray on Admission: I personally reviewed and I agree with radiologist reading as below.  CT Head Wo Contrast  Result Date: 09/04/2022 CLINICAL DATA:  Mental status change, unknown cause EXAM: CT HEAD  WITHOUT CONTRAST TECHNIQUE: Contiguous axial images were obtained from the base of the skull through the vertex without intravenous contrast. RADIATION DOSE REDUCTION: This exam was performed according to the departmental dose-optimization program which includes automated exposure control, adjustment of the mA and/or kV according to patient size and/or use of iterative reconstruction technique. COMPARISON:  08/17/2022 FINDINGS: Brain: No evidence of acute infarction, hemorrhage, hydrocephalus, extra-axial collection or mass lesion/mass effect. Focal encephalomalacia in the anterior right frontal lobe at site of previously seen hemorrhagic contusion. No residual blood products are evident by CT. Patchy low-density changes within the periventricular and subcortical white matter compatible with chronic microvascular ischemic change. Mild diffuse cerebral volume loss. Vascular: No hyperdense vessel or unexpected calcification. Skull: Normal. Negative for fracture or focal lesion. Sinuses/Orbits: No acute finding. Other: None. IMPRESSION: 1. No acute intracranial abnormality. 2. Focal encephalomalacia in the anterior right frontal lobe at site of previously seen hemorrhagic contusion. No residual blood products are evident by CT. Electronically Signed   By: Davina Poke D.O.   On: 09/30/2022 13:32   DG Chest Portable 1 View  Result Date: 09/19/2022 CLINICAL DATA:  Cough and weakness. EXAM: PORTABLE CHEST 1 VIEW COMPARISON:  None Available. FINDINGS: The cardiac silhouette, mediastinal and hilar contours are within normal limits. Bilateral pulmonary infiltrates consistent with polylobar pneumonia. There are associated bilateral parapneumonic effusions. No pulmonary lesions or pneumothorax. The bony thorax is intact. IMPRESSION: Polylobar pneumonia and parapneumonic effusions. Electronically Signed   By: Marijo Sanes M.D.   On: 09/16/2022 13:12    Labs on Admission: I have personally reviewed following  labs  CBC: Recent Labs  Lab 09/03/2022 1158  WBC 9.3  NEUTROABS 7.9*  HGB 8.9*  HCT 27.2*  MCV 90.4  PLT [id number]   Basic Metabolic Panel: Recent Labs  Lab 09/26/2022 1148 09/24/2022 1158  NA  --  126*  K  --  3.4*  CL  --  94*  CO2  --  25  GLUCOSE  --  104*  BUN  --  14  CREATININE  --  1.00  CALCIUM  --  7.5*  MG  --  1.7  PHOS 1.7*  --    GFR: Estimated Creatinine Clearance: 28.8 mL/min (by C-G formula based on SCr of 1 mg/dL).  Liver Function Tests: Recent Labs  Lab 09/28/2022 1158  AST 80*  ALT 66*  ALKPHOS 69  BILITOT 1.2  PROT 5.5*  ALBUMIN 3.0*   Cardiac Enzymes: Recent Labs  Lab 09/16/2022 1158  CKTOTAL 153   Thyroid Function Tests: Recent Labs    09/17/2022 1158  TSH 3.113   Dr. Tobie Poet [facility]  If 7PM-7AM, please contact overnight-coverage provider If 7AM-7PM, please contact day coverage provider [email or web address]  09/09/2022, 6:53 PM

## 2022-09-18 NOTE — Assessment & Plan Note (Signed)
-   Check serum B12 level

## 2022-09-18 NOTE — Assessment & Plan Note (Signed)
-   Continue with ceftriaxone 2 mg IV and azithromycin 500 mg IV - Procalcitonin on admission for baseline and to assist with assessing antibiotic effectiveness for tomorrow - Check MRSA PCR - Admit to telemetry medical, inpatient

## 2022-09-18 NOTE — ED Provider Notes (Signed)
[facility] Provider Note    Event Date/Time   First MD Initiated Contact with Patient 10/09/2022 1442     (approximate)   History   Chief Complaint: Weakness and Fall   HPI  Cindy Reed is a 88 y.o. female with a history of CKD, paroxysmal atrial fibrillation, fall 1 month ago with subarachnoid hemorrhage who comes to the ED today due to generalized weakness.  Reportedly patient had been doing well with home physical therapy over the past month and improving with her strength and function, and then about 5 days ago she started having loss of appetite and poor oral intake.  She also has been having a persistent cough.  Over the last 4 days she has had profound generalized weakness that is been worsening to the point that today she could not even sit upright or get herself out of bed.  She had a mechanical fall today due to her weakness, she denies losing consciousness or hitting her head.  Denies any pain right now.  She does have some mild shortness of breath.     Physical Exam   Triage Vital Signs: ED Triage Vitals  Enc Vitals Group     BP Oct 09, 2022 1148 124/65     Pulse Rate Oct 09, 2022 1148 79     Resp 2022/10/09 1148 16     Temp October 09, 2022 1148 98.5 F (36.9 C)     Temp Source October 09, 2022 1148 Oral     SpO2 10/09/2022 1148 92 %     Weight 10-09-22 1153 111 lb 1.8 oz (50.4 kg)     Height 10-09-2022 1153 5\' 1"  (1.549 m)     Head Circumference --      Peak Flow --      Pain Score 10/09/2022 1153 0     Pain Loc --      Pain Edu? --      Excl. in [place]? --     Most recent vital signs: Vitals:   10-09-22 1148  BP: 124/65  Pulse: 79  Resp: 16  Temp: 98.5 F (36.9 C)  SpO2: 92%    General: Awake, no distress.  CV:  Good peripheral perfusion.  Regular rate and rhythm. Resp:  Normal effort.  Decreased breath sounds on the right.  No crackles or wheezing. Abd:  No distention.  Soft nontender Other:  Dry mucous membranes.  No lower extremity edema.  No signs of  trauma.   ED Results / Procedures / Treatments   Labs (all labs ordered are listed, but only abnormal results are displayed) Labs Reviewed  COMPREHENSIVE METABOLIC PANEL - Abnormal; Notable for the following components:      Result Value   Sodium 126 (*)    Potassium 3.4 (*)    Chloride 94 (*)    Glucose, Bld 104 (*)    Calcium 7.5 (*)    Total Protein 5.5 (*)    Albumin 3.0 (*)    AST 80 (*)    ALT 66 (*)    GFR, Estimated 54 (*)    All other components within normal limits  CBC WITH DIFFERENTIAL/PLATELET - Abnormal; Notable for the following components:   RBC 3.01 (*)    Hemoglobin 8.9 (*)    HCT 27.2 (*)    Neutro Abs 7.9 (*)    Lymphs Abs 0.2 (*)    Monocytes Absolute 1.1 (*)    All other components within normal limits  RESP PANEL BY RT-PCR (FLU A&B, COVID)  ARPGX2  MRSA NEXT GEN BY PCR, NASAL  MAGNESIUM  TSH  CK  URINALYSIS, ROUTINE W REFLEX MICROSCOPIC  PHOSPHORUS  MAGNESIUM  PROCALCITONIN     EKG Interpreted by me Sinus rhythm rate of 78.  Normal axis, normal intervals.  Poor R wave progression.  Normal ST segments and T waves.  No ischemic changes.   RADIOLOGY X-ray interpreted by me, shows right upper lobe and right lower lobe consolidation consistent with pneumonia.  Radiology report reviewed   PROCEDURES:  Procedures   MEDICATIONS ORDERED IN ED: Medications  levothyroxine (SYNTHROID) tablet 25 mcg (has no administration in time range)  acetaminophen (TYLENOL) tablet 650 mg (has no administration in time range)    Or  acetaminophen (TYLENOL) suppository 650 mg (has no administration in time range)  ondansetron (ZOFRAN) tablet 4 mg (has no administration in time range)    Or  ondansetron (ZOFRAN) injection 4 mg (has no administration in time range)  cefTRIAXone (ROCEPHIN) 2 g in sodium chloride 0.9 % 100 mL IVPB (2 g Intravenous New Bag/Given 09/03/2022 1458)  azithromycin (ZITHROMAX) 500 mg in sodium chloride 0.9 % 250 mL IVPB (has no  administration in time range)  senna-docusate (Senokot-S) tablet 1 tablet (has no administration in time range)  acetaminophen (TYLENOL) tablet 1,000 mg (has no administration in time range)  cyanocobalamin (VITAMIN B12) injection 1,000 mcg (has no administration in time range)  sodium chloride 0.9 % bolus 1,000 mL (0 mLs Intravenous Stopped 10/01/2022 1458)     IMPRESSION / MDM / ASSESSMENT AND PLAN / ED COURSE  I reviewed the triage vital signs and the nursing notes.                              Differential diagnosis includes, but is not limited to, pneumonia, pleural effusion, pulmonary edema, electrolyte abnormality, AKI, dehydration, viral illness, hypothyroidism, rhabdomyolysis, delayed subdural hematoma  Patient's presentation is most consistent with acute presentation with potential threat to life or bodily function.  Patient presents with generalized weakness, now unable to hold herself upright or get out of bed.  Oxygen saturation dropping down to 88% on room air, requiring 2 L nasal cannula.  Exam and x-ray consistent with pneumonia on the right side.  Rocephin and azithromycin started.  She is not septic.  Labs show acute on chronic hyponatremia, otherwise at baseline.  Case discussed with hospitalist for further management.       FINAL CLINICAL IMPRESSION(S) / ED DIAGNOSES   Final diagnoses:  Acute respiratory failure with hypoxia ([place])  Community acquired pneumonia of right lung, unspecified part of lung  Generalized weakness  Hyponatremia     Rx / DC Orders   ED Discharge Orders     None        Note:  This document was prepared using Dragon voice recognition software and may include unintentional dictation errors.   Carrie Mew, MD 09/01/2022 [phone]

## 2022-09-18 NOTE — Assessment & Plan Note (Addendum)
-   Fall precautions - AM team to consult PT/OT as above

## 2022-09-18 NOTE — Assessment & Plan Note (Addendum)
With B12 deficiency anemia - Serum B12 level on 08/17/2022: 170 - Patient states that she is taking p.o. B12 over-the-counter - We will check serum B12 levels, if deficient would recommend a.m. team to give patient IM injections

## 2022-09-18 NOTE — ED Notes (Signed)
Pt hypoxic on RA - placed on 2L  - PA/MD made aware

## 2022-09-18 NOTE — Assessment & Plan Note (Signed)
-   Status post sodium chloride 1 L bolus per EDP, therefore no additional sodium chloride by myself - Repeat BMP in the a.m., a.m. team to order more IV fluid as needed - Encourage p.o. intake

## 2022-09-18 NOTE — Assessment & Plan Note (Signed)
At baseline 

## 2022-09-18 NOTE — ED Triage Notes (Signed)
Pt here via [facility] with weakness and a mechanical fall today. Pt is dehydrated per ems, pt also fell 6 weeks ago that resulted in a concussion and stitches to the back of her head. Pt denies LOC from fall today, pt has not been eating and drinking a lot lately. Pt states she has not had any strength lately.   143-cbg 101.3 93% RA 18G LFA-250cc of saline given

## 2022-09-18 NOTE — Assessment & Plan Note (Signed)
-   Levothyroxine 25 mcg daily resumed 

## 2022-09-18 NOTE — Hospital Course (Signed)
Ms. Cindy Reed is an 88 year old female with history of depression, anxiety, atrial fibrillation on aspirin and amiodarone, hypothyroid, recent history of traumatic subarachnoid hemorrhage, who presents to the emergency department for chief concerns of falling and weakness.  Initial vitals in the emergency department showed temperature of 98.5, respiration rate of 16, heart rate of 79, blood pressure 124/65, SPO2 of 88% on room air and has improved to 92% on 2 L nasal cannula.  Serum sodium is 126, potassium 3.4, chloride 94, bicarb 25, BUN of 14, serum creatinine of 1.00, GFR 54, nonfasting blood glucose 104, WBC 9.3, hemoglobin 8.9, platelets of 185.    CK was 153.  TSH is 3.113.    COVID/influenza A/influenza B PCR were negative.  ED treatment: Azithromycin 500 mg and ceftriaxone 2 g IV.  Sodium chloride 1 L bolus.

## 2022-09-19 ENCOUNTER — Inpatient Hospital Stay: Payer: Medicare PPO

## 2022-09-19 ENCOUNTER — Inpatient Hospital Stay (HOSPITAL_COMMUNITY)
Admit: 2022-09-19 | Discharge: 2022-09-19 | Disposition: A | Discharge status: Home / Self Care | Payer: Medicare PPO | Attending: Internal Medicine | Admitting: Internal Medicine

## 2022-09-19 ENCOUNTER — Encounter: Payer: Self-pay | Admitting: Internal Medicine

## 2022-09-19 DIAGNOSIS — E538 Deficiency of other specified B group vitamins: Secondary | ICD-10-CM | POA: Diagnosis not present

## 2022-09-19 DIAGNOSIS — J9601 Acute respiratory failure with hypoxia: Secondary | ICD-10-CM | POA: Diagnosis not present

## 2022-09-19 DIAGNOSIS — J189 Pneumonia, unspecified organism: Secondary | ICD-10-CM | POA: Diagnosis not present

## 2022-09-19 DIAGNOSIS — R0609 Other forms of dyspnea: Secondary | ICD-10-CM

## 2022-09-19 DIAGNOSIS — R296 Repeated falls: Secondary | ICD-10-CM | POA: Diagnosis not present

## 2022-09-19 LAB — BASIC METABOLIC PANEL
Anion gap: 5 (ref 5–15)
BUN: 12 mg/dL (ref 8–23)
CO2: 21 mmol/L — ABNORMAL LOW (ref 22–32)
Calcium: 6.9 mg/dL — ABNORMAL LOW (ref 8.9–10.3)
Chloride: 99 mmol/L (ref 98–111)
Creatinine, Ser: 0.79 mg/dL (ref 0.44–1.00)
GFR, Estimated: >60 mL/min (ref >60)
Glucose, Bld: 90 mg/dL (ref 70–99)
Potassium: 3.1 mmol/L — ABNORMAL LOW (ref 3.5–5.1)
Sodium: 125 mmol/L — ABNORMAL LOW (ref 135–145)

## 2022-09-19 LAB — PROCALCITONIN: Procalcitonin: 0.28 ng/mL

## 2022-09-19 LAB — CBC
HCT: 27.9 % — ABNORMAL LOW (ref 36.0–46.0)
Hemoglobin: 9 g/dL — ABNORMAL LOW (ref 12.0–15.0)
MCH: 29.5 pg (ref 26.0–34.0)
MCHC: 32.3 g/dL (ref 30.0–36.0)
MCV: 91.5 fL (ref 80.0–100.0)
Platelets: 188 10*3/uL (ref 150–400)
RBC: 3.05 MIL/uL — ABNORMAL LOW (ref 3.87–5.11)
RDW: 13.3 % (ref 11.5–15.5)
WBC: 10.5 10*3/uL (ref 4.0–10.5)
nRBC: 0 % (ref 0.0–0.2)

## 2022-09-19 LAB — ECHOCARDIOGRAM COMPLETE
AR max vel: 3.42 cm2
AV Area VTI: 4 cm2
AV Area mean vel: 2.98 cm2
AV Mean grad: 2 mmHg
AV Peak grad: 4 mmHg
Ao pk vel: 1 m/s
Area-P 1/2: 3.36 cm2
Height: 61 in
S' Lateral: 2.3 cm
Weight: 1777.79 oz

## 2022-09-19 LAB — URINALYSIS, ROUTINE W REFLEX MICROSCOPIC
Bacteria, UA: NONE SEEN
Bilirubin Urine: NEGATIVE
Glucose, UA: NEGATIVE mg/dL
Hgb urine dipstick: NEGATIVE
Ketones, ur: 20 mg/dL — AB
Leukocytes,Ua: NEGATIVE
Nitrite: NEGATIVE
Protein, ur: 30 mg/dL — AB
Specific Gravity, Urine: 1.018 (ref 1.005–1.030)
pH: 6 (ref 5.0–8.0)

## 2022-09-19 LAB — OSMOLALITY, URINE: Osmolality, Ur: 510 mOsm/kg (ref 300–900)

## 2022-09-19 LAB — VITAMIN B12: Vitamin B-12: 994 pg/mL — ABNORMAL HIGH (ref 180–914)

## 2022-09-19 LAB — OSMOLALITY: Osmolality: 264 mOsm/kg — ABNORMAL LOW (ref 275–295)

## 2022-09-19 LAB — BRAIN NATRIURETIC PEPTIDE: B Natriuretic Peptide: 370.3 pg/mL — ABNORMAL HIGH (ref 0.0–100.0)

## 2022-09-19 LAB — SODIUM, URINE, RANDOM: Sodium, Ur: 17 mmol/L

## 2022-09-19 LAB — SODIUM: Sodium: 128 mmol/L — ABNORMAL LOW (ref 135–145)

## 2022-09-19 MED ORDER — GUAIFENESIN-DM 100-10 MG/5ML PO SYRP
5.0000 mL | ORAL_SOLUTION | ORAL | Status: DC | PRN
  Administered 2022-09-19: 5 mL via ORAL
  Filled 2022-09-19: qty 10

## 2022-09-19 MED ORDER — LEVOTHYROXINE SODIUM 50 MCG PO TABS
50.0000 ug | ORAL_TABLET | Freq: Every day | ORAL | Status: DC
  Administered 2022-09-20 – 2022-09-21 (×2): 50 ug via ORAL
  Filled 2022-09-19 (×2): qty 1

## 2022-09-19 MED ORDER — FUROSEMIDE 20 MG PO TABS
10.0000 mg | ORAL_TABLET | Freq: Every day | ORAL | Status: DC
  Administered 2022-09-20: 10 mg via ORAL
  Filled 2022-09-19: qty 1

## 2022-09-19 MED ORDER — GUAIFENESIN ER 600 MG PO TB12
600.0000 mg | ORAL_TABLET | Freq: Two times a day (BID) | ORAL | Status: DC
  Administered 2022-09-19 – 2022-09-21 (×5): 600 mg via ORAL
  Filled 2022-09-19 (×6): qty 1

## 2022-09-19 MED ORDER — POTASSIUM CHLORIDE CRYS ER 20 MEQ PO TBCR
40.0000 meq | EXTENDED_RELEASE_TABLET | Freq: Once | ORAL | Status: AC
  Administered 2022-09-19: 40 meq via ORAL
  Filled 2022-09-19: qty 2

## 2022-09-19 MED ORDER — BENZONATATE 100 MG PO CAPS
100.0000 mg | ORAL_CAPSULE | Freq: Three times a day (TID) | ORAL | Status: DC
  Administered 2022-09-19 – 2022-09-21 (×8): 100 mg via ORAL
  Filled 2022-09-19 (×9): qty 1

## 2022-09-19 MED ORDER — SODIUM CHLORIDE 1 G PO TABS
1.0000 g | ORAL_TABLET | Freq: Three times a day (TID) | ORAL | Status: DC
  Administered 2022-09-19 – 2022-09-20 (×2): 1 g via ORAL
  Filled 2022-09-19 (×2): qty 1

## 2022-09-19 MED ORDER — FUROSEMIDE 10 MG/ML IJ SOLN
20.0000 mg | Freq: Once | INTRAMUSCULAR | Status: AC
  Administered 2022-09-19: 20 mg via INTRAVENOUS
  Filled 2022-09-19: qty 2

## 2022-09-19 MED ORDER — FUROSEMIDE 20 MG PO TABS: 10.0000 mg | ORAL_TABLET | Freq: Every day | ORAL | Status: DC

## 2022-09-19 NOTE — Care Management Important Message (Signed)
Important Message  Patient Details  Name: TAHNI PORCHIA MRN: [id number] Date of Birth: 03/16/1931   Medicare Important Message Given:  N/A - LOS <3 / Initial given by admissions     Dannette Barbara 09/19/2022, 6:27 PM

## 2022-09-19 NOTE — Progress Notes (Signed)
[facility]                                                                              Cindy Reed, is a 88 y.o. female, DOB - [birth year], [id number] Admit date - 09/16/2022    Outpatient Primary MD for the patient is [name], MD  LOS - 1  days  Chief Complaint  Patient presents with   Weakness   Fall       Brief summary   Patient is a 88 year old female with depression, anxiety, A-fib on aspirin, amiodarone, hypothyroidism, recent history of traumatic SAH presented to ED with falling and weakness.  Patient was alert and oriented, reported that she slowly collapsed against the close abdominal with her back on her gluteal, otherwise did not hit her head or loss of consciousness.  She just felt very weak. Vital signs stable, however sats 88% on room air that improved to 92% on 2 L O2 via [place] COVID-19 negative, influenza negative TSH 3.1, CK1 53 Sodium 126  Assessment & Plan    Principal Problem: Acute respiratory failure with hypoxia due to multifocal pneumonia, bilateral pleural effusions -O2 sats 88% on room air in ED, improved to 92% on 2 L -Procalcitonin 0.17 -> 0.28  -Two-view chest x-ray repeated today showing increasing right upper lobe and bilateral lower lobes right greater than left airspace opacities and bilateral pleural effusions right greater than left.  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  Sodium 129 on 9/16 -Sodium trended down to 125 today, -Serum osmolality 264, urine osmolality 510, urine sodium 17, consistent with SIADH -Discontinue IV fluids, started on salt tabs 1 g 3 times daily, fluid restriction, Lasix 20 mg IV x1 then continue 10 mg daily -Hold  fluoxetine  Hypokalemia -Replaced   Hypertension -BP stable HCTZ discontinued    Paroxysmal atrial fibrillation (HCC) -Rate controlled, continue amiodarone -Not on any anticoagulation due to recent [facility] and falls. -Current on aspirin 81 mg daily, will continue    Stage 3a chronic kidney disease ([place]) -Creatinine at baseline, 1.0 on admission     Frequent falls with generalized weakness  -PT OT evaluation    Hypothyroidism -Continue Synthroid  History of B12 deficiency -Continue B12 replacement  Code Status: Full code DVT Prophylaxis:  Place TED hose Start: 09/06/2022 1441   Level of Care: Level of care: Telemetry Medical Family Communication: Updated patient's daughter at the bedside   Disposition Plan:      Remains inpatient appropriate: Work-up in progress   Procedures:  None  Consultants:   None  Antimicrobials:   Anti-infectives (From admission, onward)    Start     Dose/Rate Route Frequency Ordered Stop   09/02/2022 1600  azithromycin (ZITHROMAX) 500 mg in sodium chloride 0.9 % 250 mL IVPB        500 mg 250 mL/hr over 60 Minutes Intravenous Every 24 hours  09/19/2022 1442 Oct 04, 2022 1559   09/19/2022 1500  cefTRIAXone (ROCEPHIN) 2 g in sodium chloride 0.9 % 100 mL IVPB        2 g 200 mL/hr over 30 Minutes Intravenous Every 24 hours 09/01/2022 1442 10/04/2022 1459   09/04/2022 1415  cefTRIAXone (ROCEPHIN) 2 g in sodium chloride 0.9 % 100 mL IVPB  Status:  Discontinued        2 g 200 mL/hr over 30 Minutes Intravenous  Once 09/19/2022 1410 09/08/2022 1442   09/02/2022 1415  azithromycin (ZITHROMAX) 500 mg in sodium chloride 0.9 % 250 mL IVPB  Status:  Discontinued        500 mg 250 mL/hr over 60 Minutes Intravenous  Once 09/24/2022 1410 09/20/2022 1442          Medications  amiodarone  200 mg Oral Daily   aspirin EC  81 mg Oral Daily   benzonatate  100 mg Oral TID   vitamin D3  1,000 Units Oral Daily   cyanocobalamin  500 mcg Oral Daily   FLUoxetine  20 mg Oral Daily    guaiFENesin  600 mg Oral BID   levothyroxine  25 mcg Oral Q0600      Subjective:   Cindy Reed was seen and examined today.  Feeling weak and coughing, no fevers or chills.  BP stable.  Does not have much of appetite.  Objective:   Vitals:   09/19/22 0900 09/19/22 0930 09/19/22 1000 09/19/22 1100  BP: 120/64 118/62 120/63 113/72  Pulse: 83 81 80 84  Resp: 18  18 18   Temp:      TempSrc:      SpO2: 96% 93% 94% 95%  Weight:      Height:        Intake/Output Summary (Last 24 hours) at 09/19/2022 1216 Last data filed at 09/11/2022 1528 Gross per 24 hour  Intake 100 ml  Output --  Net 100 ml     Wt Readings from Last 3 Encounters:  09/29/2022 50.4 kg  08/16/22 50.4 kg  08/16/20 50.3 kg     Exam General: Alert and oriented x 3, NAD Cardiovascular: S1 S2 auscultated,  RRR Respiratory: Bilateral rhonchi Gastrointestinal: Soft, nontender, nondistended, + bowel sounds Ext: no pedal edema bilaterally Neuro: no new FND's Psych: Normal affect and demeanor, alert and oriented x3     Data Reviewed:  I have personally reviewed following labs    CBC Lab Results  Component Value Date   WBC 10.5 09/19/2022   RBC 3.05 (L) 09/19/2022   HGB 9.0 (L) 09/19/2022   HCT 27.9 (L) 09/19/2022   MCV 91.5 09/19/2022   MCH 29.5 09/19/2022   PLT 188 09/19/2022   MCHC 32.3 09/19/2022   RDW 13.3 09/19/2022   LYMPHSABS 0.2 (L) 09/09/2022   MONOABS 1.1 (H) 09/19/2022   EOSABS 0.0 09/19/2022   BASOSABS 0.0 69/62/9528     Last metabolic panel Lab Results  Component Value Date   NA 125 (L) 09/19/2022   K 3.1 (L) 09/19/2022   CL 99 09/19/2022   CO2 21 (L) 09/19/2022   BUN 12 09/19/2022   CREATININE 0.79 09/19/2022   GLUCOSE 90 09/19/2022   GFRNONAA >60 09/19/2022   CALCIUM 6.9 (L) 09/19/2022   PHOS 1.7 (L) 09/28/2022   PROT 5.5 (L) 09/17/2022   ALBUMIN 3.0 (L) 09/11/2022   BILITOT 1.2 09/16/2022   ALKPHOS 69 09/14/2022   AST 80 (H) 09/26/2022   ALT 66 (H) 09/21/2022    ANIONGAP 5  09/19/2022    CBG (last 3)  No results for input(s): "GLUCAP" in the last 72 hours.    Coagulation Profile: No results for input(s): "INR", "PROTIME" in the last 168 hours.   Radiology Studies: I have personally reviewed the imaging studies  DG Chest 2 View  Result Date: 09/19/2022 CLINICAL DATA:  Multifocal pneumonia. EXAM: CHEST - 2 VIEW COMPARISON:  One-view chest x-ray 09/06/2022 FINDINGS: Heart size is normal. Right upper lobe and bilateral lower lobes, right greater than left airspace opacities have increased. Bilateral pleural effusions have increased, right greater than left. The visualized soft tissues and bony thorax are unremarkable. IMPRESSION: Increasing right upper lobe and bilateral lower lobes, right greater than left airspace opacities and bilateral pleural effusions, right greater than left. Findings are compatible with multifocal pneumonia. Increased opacities may be due to disease progression or rehydration. Electronically Signed   By: San Morelle M.D.   On: 09/19/2022 08:53   CT Head Wo Contrast  Result Date: 09/08/2022 CLINICAL DATA:  Mental status change, unknown cause EXAM: CT HEAD WITHOUT CONTRAST TECHNIQUE: Contiguous axial images were obtained from the base of the skull through the vertex without intravenous contrast. RADIATION DOSE REDUCTION: This exam was performed according to the departmental dose-optimization program which includes automated exposure control, adjustment of the mA and/or kV according to patient size and/or use of iterative reconstruction technique. COMPARISON:  08/17/2022 FINDINGS: Brain: No evidence of acute infarction, hemorrhage, hydrocephalus, extra-axial collection or mass lesion/mass effect. Focal encephalomalacia in the anterior right frontal lobe at site of previously seen hemorrhagic contusion. No residual blood products are evident by CT. Patchy low-density changes within the periventricular and subcortical white matter  compatible with chronic microvascular ischemic change. Mild diffuse cerebral volume loss. Vascular: No hyperdense vessel or unexpected calcification. Skull: Normal. Negative for fracture or focal lesion. Sinuses/Orbits: No acute finding. Other: None. IMPRESSION: 1. No acute intracranial abnormality. 2. Focal encephalomalacia in the anterior right frontal lobe at site of previously seen hemorrhagic contusion. No residual blood products are evident by CT. Electronically Signed   By: Davina Poke D.O.   On: 09/09/2022 13:32   DG Chest Portable 1 View  Result Date: 09/15/2022 CLINICAL DATA:  Cough and weakness. EXAM: PORTABLE CHEST 1 VIEW COMPARISON:  None Available. FINDINGS: The cardiac silhouette, mediastinal and hilar contours are within normal limits. Bilateral pulmonary infiltrates consistent with polylobar pneumonia. There are associated bilateral parapneumonic effusions. No pulmonary lesions or pneumothorax. The bony thorax is intact. IMPRESSION: Polylobar pneumonia and parapneumonic effusions. Electronically Signed   By: Marijo Sanes M.D.   On: 09/17/2022 13:12       Rumi Taras M.D. [facility] 09/19/2022, 12:16 PM  Available via Epic secure chat 7am-7pm After 7 pm, please refer to night coverage provider listed on amion.

## 2022-09-19 NOTE — Evaluation (Signed)
Physical Therapy Evaluation Patient Details Name: Cindy Reed MRN: [id number] DOB: 01/06/1931 Today's Date: 09/19/2022  History of Present Illness  Cindy Reed is an 88 year old female with history of depression, anxiety, atrial fibrillation on aspirin and amiodarone, hypothyroid, recent history of traumatic subarachnoid hemorrhage, who presents to the emergency department for chief concerns of falling and weakness.  Clinical Impression  Patient received in bed, she is agreeable to PT assessment, reports fatigue. She required min A for bed mobility, transfers and gait. She has low O2 saturations on 3 liters at 85%, instructed in PLB and she was able to bring it up to 92%. Patient at 85% again after minimal activity. RN notified and O2 bumped up to 4 liters. Patient will continue to benefit from skilled PT to improve activity tolerance and strength for safe return home.          Recommendations for follow up therapy are one component of a multi-disciplinary discharge planning process, led by the attending physician.  Recommendations may be updated based on patient status, additional functional criteria and insurance authorization.  Follow Up Recommendations Home health PT      Assistance Recommended at Discharge None  Patient can return home with the following  A little help with walking and/or transfers;A little help with bathing/dressing/bathroom;Help with stairs or ramp for entrance    Equipment Recommendations None recommended by PT  Recommendations for Other Services       Functional Status Assessment Patient has had a recent decline in their functional status and demonstrates the ability to make significant improvements in function in a reasonable and predictable amount of time.     Precautions / Restrictions Precautions Precautions: Fall Restrictions Weight Bearing Restrictions: No      Mobility  Bed Mobility Overal bed mobility: Needs Assistance Bed Mobility: Supine to  Sit, Sit to Supine     Supine to sit: Min assist, HOB elevated Sit to supine: Supervision        Transfers Overall transfer level: Needs assistance Equipment used: 1 person hand held assist Transfers: Sit to/from Stand, Bed to chair/wheelchair/BSC Sit to Stand: Min guard   Step pivot transfers: Min guard            Ambulation/Gait Ambulation/Gait assistance: Min guard Gait Distance (Feet): 3 Feet Assistive device: 1 person hand held assist Gait Pattern/deviations: Step-to pattern, Decreased step length - right, Decreased step length - left Gait velocity: decr     General Gait Details: patient able to transfer to bsc and then to sink to wash hands  Stairs            Wheelchair Mobility    Modified Rankin (Stroke Patients Only)       Balance Overall balance assessment: Needs assistance Sitting-balance support: Feet supported Sitting balance-Leahy Scale: Good     Standing balance support: Single extremity supported, During functional activity Standing balance-Leahy Scale: Fair                               Pertinent Vitals/Pain Pain Assessment Pain Assessment: Faces Faces Pain Scale: Hurts a little bit Pain Location: back with supine to sit Pain Descriptors / Indicators: Grimacing, Discomfort Pain Intervention(s): Monitored during session, Repositioned    Home Living Family/patient expects to be discharged to:: Private residence Living Arrangements: Alone Available Help at Discharge: Family;Available PRN/intermittently Type of Home: House Home Access: Stairs to enter Entrance Stairs-Rails: Left Entrance Stairs-Number of Steps: 3-4 Alternate  Level Stairs-Number of Steps: 5 Home Layout: Multi-level Home Equipment: Conservation officer, nature (2 wheels);Cane - single point      Prior Function Prior Level of Function : Independent/Modified Independent;History of Falls (last six months)             Mobility Comments: uses walker or SPC at  home, lately using RW more since fall about a month ago. Had been getting HHPT since then. She does not drive. Daughters can provide more support as needed on discharge. ADLs Comments: mod I     Hand Dominance   Dominant Hand: Right    Extremity/Trunk Assessment   Upper Extremity Assessment Upper Extremity Assessment: Overall WFL for tasks assessed    Lower Extremity Assessment Lower Extremity Assessment: Generalized weakness    Cervical / Trunk Assessment Cervical / Trunk Assessment: Other exceptions (scoliosis)  Communication   Communication: HOH  Cognition Arousal/Alertness: Awake/alert Behavior During Therapy: WFL for tasks assessed/performed Overall Cognitive Status: Within Functional Limits for tasks assessed                                          General Comments      Exercises     Assessment/Plan    PT Assessment Patient needs continued PT services  PT Problem List Decreased strength;Decreased mobility;Cardiopulmonary status limiting activity;Decreased activity tolerance;Decreased balance       PT Treatment Interventions DME instruction;Therapeutic exercise;Gait training;Stair training;Functional mobility training;Therapeutic activities;Patient/family education;Balance training    PT Goals (Current goals can be found in the Care Plan section)  Acute Rehab PT Goals Patient Stated Goal: to return home with HHPT PT Goal Formulation: With patient/family Time For Goal Achievement: 10/01/22 Potential to Achieve Goals: Good    Frequency Min 2X/week     Co-evaluation               AM-PAC PT "6 Clicks" Mobility  Outcome Measure Help needed turning from your back to your side while in a flat bed without using bedrails?: A Little Help needed moving from lying on your back to sitting on the side of a flat bed without using bedrails?: A Little Help needed moving to and from a bed to a chair (including a wheelchair)?: A Little Help  needed standing up from a chair using your arms (e.g., wheelchair or bedside chair)?: A Little Help needed to walk in hospital room?: A Lot Help needed climbing 3-5 steps with a railing? : A Lot 6 Click Score: 16    End of Session Equipment Utilized During Treatment: Oxygen Activity Tolerance: Patient limited by lethargy Patient left: in bed;with call bell/phone within reach;with family/visitor present Nurse Communication: Mobility status PT Visit Diagnosis: Unsteadiness on feet (R26.81);Muscle weakness (generalized) (M62.81);History of falling (Z91.81);Difficulty in walking, not elsewhere classified (R26.2)    Time: 9563-8756 PT Time Calculation (min) (ACUTE ONLY): 20 min   Charges:   PT Evaluation $PT Eval Moderate Complexity: 1 Mod PT Treatments $Therapeutic Activity: 8-22 mins        Tahisha Hakim, PT, GCS 09/19/22,3:11 PM

## 2022-09-19 NOTE — Progress Notes (Signed)
*  PRELIMINARY RESULTS* Echocardiogram 2D Echocardiogram has been performed.  Sherrie Sport 09/19/2022, 1:55 PM

## 2022-09-20 ENCOUNTER — Inpatient Hospital Stay: Payer: Medicare PPO

## 2022-09-20 DIAGNOSIS — R296 Repeated falls: Secondary | ICD-10-CM | POA: Diagnosis not present

## 2022-09-20 DIAGNOSIS — E538 Deficiency of other specified B group vitamins: Secondary | ICD-10-CM | POA: Diagnosis not present

## 2022-09-20 DIAGNOSIS — J9601 Acute respiratory failure with hypoxia: Secondary | ICD-10-CM | POA: Diagnosis not present

## 2022-09-20 DIAGNOSIS — J189 Pneumonia, unspecified organism: Secondary | ICD-10-CM | POA: Diagnosis not present

## 2022-09-20 LAB — BASIC METABOLIC PANEL
Anion gap: 9 (ref 5–15)
BUN: 15 mg/dL (ref 8–23)
CO2: 25 mmol/L (ref 22–32)
Calcium: 7 mg/dL — ABNORMAL LOW (ref 8.9–10.3)
Chloride: 94 mmol/L — ABNORMAL LOW (ref 98–111)
Creatinine, Ser: 0.81 mg/dL (ref 0.44–1.00)
GFR, Estimated: >60 mL/min (ref >60)
Glucose, Bld: 119 mg/dL — ABNORMAL HIGH (ref 70–99)
Potassium: 3.1 mmol/L — ABNORMAL LOW (ref 3.5–5.1)
Sodium: 128 mmol/L — ABNORMAL LOW (ref 135–145)

## 2022-09-20 LAB — C DIFFICILE QUICK SCREEN W PCR REFLEX
C Diff antigen: NEGATIVE
C Diff interpretation: NOT DETECTED
C Diff toxin: NEGATIVE

## 2022-09-20 LAB — PROCALCITONIN: Procalcitonin: 0.36 ng/mL

## 2022-09-20 MED ORDER — SODIUM CHLORIDE 0.9 % IV SOLN
2.0000 g | Freq: Two times a day (BID) | INTRAVENOUS | Status: DC
  Administered 2022-09-20 – 2022-09-21 (×3): 2 g via INTRAVENOUS
  Filled 2022-09-20: qty 12.5
  Filled 2022-09-20 (×2): qty 2
  Filled 2022-09-20: qty 12.5

## 2022-09-20 MED ORDER — BOOST / RESOURCE BREEZE PO LIQD CUSTOM
1.0000 | Freq: Three times a day (TID) | ORAL | Status: DC
  Administered 2022-09-20: 1 via ORAL

## 2022-09-20 MED ORDER — VANCOMYCIN HCL 750 MG/150ML IV SOLN
750.0000 mg | INTRAVENOUS | Status: DC
  Filled 2022-09-20: qty 150

## 2022-09-20 MED ORDER — FUROSEMIDE 10 MG/ML IJ SOLN
40.0000 mg | Freq: Once | INTRAMUSCULAR | Status: AC
  Administered 2022-09-20: 40 mg via INTRAVENOUS
  Filled 2022-09-20: qty 4

## 2022-09-20 MED ORDER — VANCOMYCIN HCL 1500 MG/300ML IV SOLN
1500.0000 mg | Freq: Once | INTRAVENOUS | Status: AC
  Administered 2022-09-20: 1500 mg via INTRAVENOUS
  Filled 2022-09-20: qty 300

## 2022-09-20 MED ORDER — MIRTAZAPINE 15 MG PO TABS
7.5000 mg | ORAL_TABLET | Freq: Every day | ORAL | Status: DC
  Administered 2022-09-20: 7.5 mg via ORAL
  Filled 2022-09-20 (×2): qty 1

## 2022-09-20 MED ORDER — IOHEXOL 350 MG/ML SOLN
75.0000 mL | Freq: Once | INTRAVENOUS | Status: AC | PRN
  Administered 2022-09-20: 75 mL via INTRAVENOUS

## 2022-09-20 MED ORDER — PROSOURCE PLUS PO LIQD
30.0000 mL | Freq: Three times a day (TID) | ORAL | Status: DC
  Administered 2022-09-20 – 2022-09-22 (×4): 30 mL via ORAL
  Filled 2022-09-20 (×8): qty 30

## 2022-09-20 MED ORDER — ENSURE ENLIVE PO LIQD
237.0000 mL | Freq: Two times a day (BID) | ORAL | Status: DC
  Administered 2022-09-21 (×2): 237 mL via ORAL

## 2022-09-20 MED ORDER — SODIUM CHLORIDE 1 G PO TABS
2.0000 g | ORAL_TABLET | Freq: Three times a day (TID) | ORAL | Status: DC
  Administered 2022-09-20: 2 g via ORAL
  Filled 2022-09-20: qty 2

## 2022-09-20 MED ORDER — FUROSEMIDE 10 MG/ML IJ SOLN: 40.0000 mg | Freq: Every day | INTRAMUSCULAR | Status: DC

## 2022-09-20 MED ORDER — SODIUM CHLORIDE 1 G PO TABS
1.0000 g | ORAL_TABLET | Freq: Three times a day (TID) | ORAL | Status: DC
  Administered 2022-09-20 – 2022-09-21 (×3): 1 g via ORAL
  Filled 2022-09-20 (×3): qty 1

## 2022-09-20 MED ORDER — ADULT MULTIVITAMIN W/MINERALS CH
1.0000 | ORAL_TABLET | Freq: Every day | ORAL | Status: DC
  Administered 2022-09-20 – 2022-09-21 (×2): 1 via ORAL
  Filled 2022-09-20 (×2): qty 1

## 2022-09-20 MED ORDER — FUROSEMIDE 10 MG/ML IJ SOLN
20.0000 mg | Freq: Once | INTRAMUSCULAR | Status: AC
  Administered 2022-09-20: 20 mg via INTRAVENOUS
  Filled 2022-09-20: qty 2

## 2022-09-20 MED ORDER — ENSURE ENLIVE PO LIQD
237.0000 mL | Freq: Two times a day (BID) | ORAL | Status: DC
  Administered 2022-09-20: 237 mL via ORAL

## 2022-09-20 NOTE — Progress Notes (Addendum)
Initial Nutrition Assessment  DOCUMENTATION CODES:   Not applicable  INTERVENTION:   -D/c Ensure Enlive po BID, each supplement provides 350 kcal and 20 grams of protein -MVI with minerals daily -Boost Breeze po TID, each supplement provides 250 kcal and 9 grams of protein  -30 ml Prosource Plus TID, each supplement provides 100 kcals and 15 grams protein -Liberalize diet to regular for widest variety of meal selections -Due to taste changes, will draw labs for zinc deficiency and replete as appropriate  -Obtain new wt  NUTRITION DIAGNOSIS:   Inadequate oral intake related to decreased appetite as evidenced by per patient/family report.  GOAL:   Patient will meet greater than or equal to 90% of their needs  MONITOR:   PO intake, Supplement acceptance  REASON FOR ASSESSMENT:   Consult Assessment of nutrition requirement/status  ASSESSMENT:   Pt female with depression, anxiety, A-fib on aspirin, amiodarone, hypothyroidism, recent history of traumatic SAH presented with falling and weakness.  Pt admitted with acute respiratory failure with hypoxia due to multifocal pneumonia, bilateral pleural effusions, and pulmonary edema.   Reviewed I/O's: +349 ml x 24 hours and +449 ml since admission  UOP: 600 ml x 24 hours   Spoke with pt and daughter at bedside. Pt is pleasant and in good spirits, smiling and watching TV when RD entered the room. Pt daughter reports pt has experienced a general decline in health over the past month, when she was last discharged from the hospital. Pt with minimal intake, which consists of 3 meals per day (Breakfast: coffee and 3 gingersnap cookies, Lunch: 1/2 ham sandwich and a bag of potato chips; Dinner: spaghetti OR salad OR meat, starch and vegetable). Dinner tends to be her best meal. Pt will sometimes "nibble" or sip on Ensure throughout the day. Pt shares that she has been experiencing taste changes, especially when eating sweets, as they leave a  sour taste in her mouth. Pt has been craving olives and it is one of the only foods that do not leave an after taste in her mouth.   Pt denies any difficulty chewing or swallowing foods. Daughter reports that pt vomited earlier this morning and MD reported concern of aspiration; noted SLP consult pending. Pt eating minimally (3 bites of pudding for lunch) and expressed frustration that she cannot eat any fruit for lunch. RD liberalized diet to regular for widest variety of meal selections in effort to optimize oral intake. Pt has been taking sips of Ensure, but states it is too thick.   Pt reports her weight has been stable and usually runs around 115#. Reviewed wt hx; wt has been stable over the past month, however, suspect charted weight may be a stated weight. RD will order weight to better assess acute weight changes. Pt lives alone and is very active, but admits to falling multiple times PTA.   Discussed importance of good meal and supplement intake. Reviewed supplements on formulary. Pt would like to try Boost Breeze and Prosource, as they are not as thick in texture as Ensure.   Medications reviewed and include vitamin D3, vitamin B-12, lasix, and remeron.   Labs reviewed: Na: 128, K: 3.1.  NUTRITION - FOCUSED PHYSICAL EXAM:  Flowsheet Row Most Recent Value  Orbital Region No depletion  Upper Arm Region Moderate depletion  Thoracic and Lumbar Region No depletion  Buccal Region No depletion  Temple Region Mild depletion  Clavicle Bone Region Moderate depletion  Clavicle and Acromion Bone Region Moderate depletion  Scapular Bone Region Moderate depletion  Dorsal Hand Mild depletion  Patellar Region Mild depletion  Anterior Thigh Region Mild depletion  Posterior Calf Region Mild depletion  Edema (RD Assessment) None  Hair Reviewed  Eyes Reviewed  Mouth Reviewed  Skin Reviewed  Nails Reviewed       Diet Order:   Diet Order             Diet regular Room service appropriate?  Yes; Fluid consistency: Thin; Fluid restriction: 1800 mL Fluid  Diet effective now                   EDUCATION NEEDS:   Education needs have been addressed  Skin:  Skin Assessment: Reviewed RN Assessment  Last BM:  09/20/22 (type 7)  Height:   Ht Readings from Last 1 Encounters:  10-03-22 5\' 1"  (1.549 m)    Weight:   Wt Readings from Last 1 Encounters:  03-Oct-2022 50.4 kg    Ideal Body Weight:  47.7 kg  BMI:  Body mass index is 20.99 kg/m.  Estimated Nutritional Needs:   Kcal:  1550-1750  Protein:  75-90 grams  Fluid:  > 1.5 L    09/20/22, RD, LDN, CDCES Registered Dietitian II Certified Diabetes Care and Education Specialist Please refer to [facility] for RD and/or RD on-call/weekend/after hours pager

## 2022-09-20 NOTE — Consult Note (Signed)
Pharmacy Antibiotic Note  Cindy Reed is a 88 y.o. female admitted on 11-Oct-2022 with pneumonia.  Pharmacy has been consulted for cefepime and vancomycin dosing.  Plan: Start cefepime 2 g q12H.   Will give vancomycin loading dose of 1500 mg x 1, followed by vancomycin 750 mg q24H for a predicted AUC of 533. Goal AUC of 400-550. Vd 0.72, IBW, Scr 0.81. Plan to obtain vancomycin levels in after 4th or 5th dose. Will order a MRSA PCR.   Height: 5\' 1"  (154.9 cm) Weight: 57.6 kg (126 lb 15.8 oz) IBW/kg (Calculated) : 47.8  Temp (24hrs), Avg:97.8 F (36.6 C), Min:97.5 F (36.4 C), Max:98.1 F (36.7 C)  Recent Labs  Lab 2022/10/11 1158 09/19/22 0459 09/20/22 0556  WBC 9.3 10.5  --   CREATININE 1.00 0.79 0.81    Estimated Creatinine Clearance: 38.4 mL/min (by C-G formula based on SCr of 0.81 mg/dL).    No Known Allergies  Antimicrobials this admission: 10/20 cefepime >>  10/20 vancomycin >>   Dose adjustments this admission: None.   Microbiology results: None.   Thank you for allowing pharmacy to be a part of this patient's care.  Oswald Hillock, PharmD, BCPS 09/20/2022 5:47 PM

## 2022-09-20 NOTE — Progress Notes (Addendum)
[facility]                                                                              Cindy Reed, is a 88 y.o. female, DOB - 1930/12/04, [id number] Admit date - 09/08/2022    Outpatient Primary MD for the patient is [name], MD  LOS - 2  days  Chief Complaint  Patient presents with   Weakness   Fall       Brief summary   Patient is a 88 year old female with depression, anxiety, A-fib on aspirin, amiodarone, hypothyroidism, recent history of traumatic SAH presented to ED with falling and weakness.  Patient was alert and oriented, reported that she slowly collapsed against the close abdominal with her back on her gluteal, otherwise did not hit her head or loss of consciousness.  She just felt very weak. Vital signs stable, however sats 88% on room air that improved to 92% on 2 L O2 via [place] COVID-19 negative, influenza negative TSH 3.1, CK1 53 Sodium 126  Assessment & Plan    Principal Problem: Acute respiratory failure with hypoxia due to multifocal pneumonia, bilateral pleural effusions, pulmonary edema -O2 sats 88% on room air in ED -Procalcitonin 0.17 -> 0.28  --BNP 370, received Lasix 20 mg IV x1 on 10/19 -Notified today AM, wheezing, short of breath, patient placed on 9 L HFNC for hypoxia. Stat CTA chest showed no PE however has bilateral layering pleural effusions with pulmonary edema and right upper lobe consolidation -Continue IV antibiotics, placed on IV Lasix -Continue flutter valve -Also had vomiting this morning, ?  Aspiration leading to hypoxia, SLP evaluation ordered Addendum: 5:00PM Back on 9LHFNC, will place on IV vanc and cefepime. 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  Sodium 129 on 9/16, then trended down to 125 on 10/19 -Serum osmolality 264, urine osmolality 510, urine sodium 17, consistent with SIADH -Sodium improving, 128.  Continue salt tabs, Lasix  -Hold fluoxetine  Hypokalemia -Replaced   Hypertension -BP stable HCTZ discontinued    Paroxysmal atrial fibrillation (HCC) -Rate controlled, continue amiodarone -Not on any anticoagulation due to recent [facility] and falls. -Current on aspirin 81 mg daily, will continue    Stage 3a chronic kidney disease (HCC) -Creatinine at baseline, 1.0  -Currently at baseline, monitor with IV Lasix    Frequent falls with generalized weakness  -PT OT evaluation    Hypothyroidism -Continue Synthroid  History of B12 deficiency -Continue B12 replacement  Nutrition -Per patient she does not feel like eating, poor appetite.  Had a vomiting episode today -Nutritional consult, placed on Ensure twice daily, Remeron 7.5 mg daily at bedtime  Code Status: Full code DVT Prophylaxis:  Place TED hose Start: 09/30/2022 1441  Level of Care: Level of care: Med-Surg Family Communication: Updated patient's daughter at the bedside today   Disposition Plan:      Remains inpatient appropriate: Work-up in progress   Procedures:  None  Consultants:   None  Antimicrobials:   Anti-infectives (From admission, onward)    Start     Dose/Rate Route Frequency Ordered Stop   09/20/2022 1600  azithromycin (ZITHROMAX) 500 mg in sodium chloride 0.9 % 250 mL IVPB        500 mg 250 mL/hr over 60 Minutes Intravenous Every 24 hours 09/01/2022 1442 10-03-22 1559   09/29/2022 1500  cefTRIAXone (ROCEPHIN) 2 g in sodium chloride 0.9 % 100 mL IVPB        2 g 200 mL/hr over 30 Minutes Intravenous Every 24 hours 09/22/2022 1442 2022-10-03 1459   09/30/2022 1415  cefTRIAXone (ROCEPHIN) 2 g in sodium chloride 0.9 % 100 mL  IVPB  Status:  Discontinued        2 g 200 mL/hr over 30 Minutes Intravenous  Once 09/25/2022 1410 09/05/2022 1442   09/09/2022 1415  azithromycin (ZITHROMAX) 500 mg in sodium chloride 0.9 % 250 mL IVPB  Status:  Discontinued        500 mg 250 mL/hr over 60 Minutes Intravenous  Once 09/13/2022 1410 09/20/2022 1442          Medications  amiodarone  200 mg Oral Daily   aspirin EC  81 mg Oral Daily   benzonatate  100 mg Oral TID   vitamin D3  1,000 Units Oral Daily   cyanocobalamin  500 mcg Oral Daily   feeding supplement  237 mL Oral BID BM   [START ON 09/21/2022] furosemide  40 mg Intravenous Daily   guaiFENesin  600 mg Oral BID   levothyroxine  50 mcg Oral Q0600   sodium chloride  1 g Oral TID WC      Subjective:   Cindy Reed was seen and examined today.  Today morning had vomiting episode, subsequently became hypoxic and was placed on 9 L HFNC weaned down to 7 L HFNC.  Does not have much of an appetite.   Objective:   Vitals:   09/20/22 0849 09/20/22 0855 09/20/22 0917 09/20/22 1117  BP:      Pulse:  78 82 78  Resp:   (!) 22   Temp:      TempSrc:      SpO2: (!) 85% 93% 94% 92%  Weight:      Height:        Intake/Output Summary (Last 24 hours) at 09/20/2022 1256 Last data filed at 09/20/2022 1233 Gross per 24 hour  Intake 710.27 ml  Output 601 ml  Net 109.27 ml     Wt Readings from Last 3 Encounters:  09/07/2022 50.4 kg  08/16/22 50.4 kg  08/16/20 50.3 kg   Physical Exam General: Alert and oriented x 3, NAD Cardiovascular: S1 S2 clear, RRR.  Respiratory: Bibasilar crackles, diminished breath sounds at the bases Gastrointestinal: Soft, nontender, nondistended, NBS Ext: no pedal edema bilaterally Neuro: no new deficits Psych: Normal affect and demeanor, alert and oriented x3     Data Reviewed:  I have personally reviewed following labs    CBC Lab Results  Component Value Date   WBC 10.5 09/19/2022   RBC 3.05 (L) 09/19/2022   HGB 9.0 (L) 09/19/2022   HCT  27.9 (L) 09/19/2022   MCV 91.5 09/19/2022   MCH 29.5 09/19/2022   PLT 188 09/19/2022  MCHC 32.3 09/19/2022   RDW 13.3 09/19/2022   LYMPHSABS 0.2 (L) 09/12/2022   MONOABS 1.1 (H) 09/19/2022   EOSABS 0.0 09/15/2022   BASOSABS 0.0 30/86/5784     Last metabolic panel Lab Results  Component Value Date   NA 128 (L) 09/20/2022   K 3.1 (L) 09/20/2022   CL 94 (L) 09/20/2022   CO2 25 09/20/2022   BUN 15 09/20/2022   CREATININE 0.81 09/20/2022   GLUCOSE 119 (H) 09/20/2022   GFRNONAA >60 09/20/2022   CALCIUM 7.0 (L) 09/20/2022   PHOS 1.7 (L) 09/17/2022   PROT 5.5 (L) 09/12/2022   ALBUMIN 3.0 (L) 09/09/2022   BILITOT 1.2 09/28/2022   ALKPHOS 69 09/13/2022   AST 80 (H) 09/14/2022   ALT 66 (H) 09/21/2022   ANIONGAP 9 09/20/2022    CBG (last 3)  No results for input(s): "GLUCAP" in the last 72 hours.    Coagulation Profile: No results for input(s): "INR", "PROTIME" in the last 168 hours.   Radiology Studies: I have personally reviewed the imaging studies  CT Angio Chest Pulmonary Embolism (PE) W or WO Contrast  Result Date: 09/20/2022 CLINICAL DATA:  Pulmonary embolism (PE) suspected, high prob EXAM: CT ANGIOGRAPHY CHEST WITH CONTRAST TECHNIQUE: Multidetector CT imaging of the chest was performed using the standard protocol during bolus administration of intravenous contrast. Multiplanar CT image reconstructions and MIPs were obtained to evaluate the vascular anatomy. RADIATION DOSE REDUCTION: This exam was performed according to the departmental dose-optimization program which includes automated exposure control, adjustment of the mA and/or kV according to patient size and/or use of iterative reconstruction technique. CONTRAST:  78mL OMNIPAQUE IOHEXOL 350 MG/ML SOLN COMPARISON:  X-ray 09/19/2022 FINDINGS: Cardiovascular: Satisfactory opacification of the pulmonary arteries to the segmental level. No evidence of pulmonary embolism. Thoracic aorta is nonaneurysmal. Scattered  atherosclerotic vascular calcifications of the aorta and coronary arteries. Normal heart size. Trace pericardial effusion. Mediastinum/Nodes: No enlarged mediastinal, hilar, or axillary lymph nodes. Thyroid gland, trachea, and esophagus demonstrate no significant findings. Lungs/Pleura: Moderate layering bilateral pleural effusions. Right greater than left bibasilar atelectasis. Widespread ground-glass opacities bilaterally with more confluent airspace consolidation in the right upper lobe. No pneumothorax. Upper Abdomen: Renal sinus cyst versus hydronephrosis involving the left kidney, which is atrophic. Small hiatal hernia. Musculoskeletal: Scoliotic thoracolumbar curvature. No acute bony or chest wall findings. Review of the MIP images confirms the above findings. IMPRESSION: 1. Negative for pulmonary embolism. 2. Widespread ground-glass opacities bilaterally with more confluent airspace consolidation in the right upper lobe, suggestive of pulmonary edema. Superimposed pneumonia not excluded. 3. Moderate layering bilateral pleural effusions with right greater than left bibasilar atelectasis. 4. Trace pericardial effusion. 5. Renal sinus cyst versus hydronephrosis involving the left kidney, which is atrophic. Dedicated renal ultrasound recommended for further assessment. 6. Small hiatal hernia. 7. Aortic and coronary artery atherosclerosis (ICD10-I70.0). Electronically Signed   By: Davina Poke D.O.   On: 09/20/2022 11:09   ECHOCARDIOGRAM COMPLETE  Result Date: 09/19/2022    ECHOCARDIOGRAM REPORT   Patient Name:   Cindy Reed Date of Exam: 09/19/2022 Medical Rec #:  [id number]  Height:       61.0 in Accession #:    [id number] Weight:       111.1 lb Date of Birth:  [birth year]  BSA:          1.471 m Patient Age:    70 years   BP:           149/66 mmHg Patient Gender:  F          HR:           88 bpm. Exam Location:  [facility] Procedure: 2D Echo, Color Doppler and Cardiac Doppler Indications:     Dyspnea R06.00   History:         Patient has no prior history of Echocardiogram examinations.                  Risk Factors:Hypertension.  Sonographer:     Sherrie Sport Referring Phys:  [id number] Jasdeep Kepner K Kiyoshi Schaab Diagnosing Phys: Ida Rogue MD  Sonographer Comments: Image quality was good. IMPRESSIONS  1. Left ventricular ejection fraction, by estimation, is 60 to 65%. The left ventricle has normal function. The left ventricle has no regional wall motion abnormalities. Left ventricular diastolic parameters are consistent with Grade I diastolic dysfunction (impaired relaxation).  2. Right ventricular systolic function is normal. The right ventricular size is normal. There is moderately elevated pulmonary artery systolic pressure. The estimated right ventricular systolic pressure is 0000000 mmHg.  3. The mitral valve is normal in structure. Mild mitral valve regurgitation. No evidence of mitral stenosis. There is mild late systolic prolapse of both leaflets of the mitral valve.  4. Tricuspid valve regurgitation is moderate.  5. The aortic valve is normal in structure. Aortic valve regurgitation is mild. Aortic valve sclerosis is present, with no evidence of aortic valve stenosis.  6. The inferior vena cava is normal in size with greater than 50% respiratory variability, suggesting right atrial pressure of 3 mmHg. FINDINGS  Left Ventricle: Left ventricular ejection fraction, by estimation, is 60 to 65%. The left ventricle has normal function. The left ventricle has no regional wall motion abnormalities. The left ventricular internal cavity size was normal in size. There is  no left ventricular hypertrophy. Left ventricular diastolic parameters are consistent with Grade I diastolic dysfunction (impaired relaxation). Right Ventricle: The right ventricular size is normal. No increase in right ventricular wall thickness. Right ventricular systolic function is normal. There is moderately elevated pulmonary artery systolic pressure. The tricuspid  regurgitant velocity is 3.61 m/s, and with an assumed right atrial pressure of 5 mmHg, the estimated right ventricular systolic pressure is 0000000 mmHg. Left Atrium: Left atrial size was normal in size. Right Atrium: Right atrial size was normal in size. Pericardium: There is no evidence of pericardial effusion. Mitral Valve: The mitral valve is normal in structure. There is mild late systolic prolapse of both leaflets of the mitral valve. There is moderate thickening of the mitral valve leaflet(s). Mild mitral valve regurgitation. No evidence of mitral valve stenosis. Tricuspid Valve: The tricuspid valve is normal in structure. Tricuspid valve regurgitation is moderate . No evidence of tricuspid stenosis. Aortic Valve: The aortic valve is normal in structure. Aortic valve regurgitation is mild. Aortic valve sclerosis is present, with no evidence of aortic valve stenosis. Aortic valve mean gradient measures 2.0 mmHg. Aortic valve peak gradient measures 4.0  mmHg. Aortic valve area, by VTI measures 4.00 cm. Pulmonic Valve: The pulmonic valve was normal in structure. Pulmonic valve regurgitation is not visualized. No evidence of pulmonic stenosis. Aorta: The aortic root is normal in size and structure. Venous: The inferior vena cava is normal in size with greater than 50% respiratory variability, suggesting right atrial pressure of 3 mmHg. IAS/Shunts: No atrial level shunt detected by color flow Doppler.  LEFT VENTRICLE PLAX 2D LVIDd:         3.70 cm   Diastology LVIDs:  2.30 cm   LV e' medial:    5.87 cm/s LV PW:         1.00 cm   LV E/e' medial:  15.2 LV IVS:        0.80 cm   LV e' lateral:   8.05 cm/s LVOT diam:     2.00 cm   LV E/e' lateral: 11.1 LV SV:         63 LV SV Index:   43 LVOT Area:     3.14 cm  RIGHT VENTRICLE RV Basal diam:  2.70 cm RV Mid diam:    2.30 cm LEFT ATRIUM             Index        RIGHT ATRIUM           Index LA diam:        2.70 cm 1.84 cm/m   RA Area:     14.90 cm LA Vol (A2C):    25.7 ml 17.47 ml/m  RA Volume:   36.30 ml  24.67 ml/m LA Vol (A4C):   29.7 ml 20.19 ml/m LA Biplane Vol: 27.8 ml 18.90 ml/m  AORTIC VALVE AV Area (Vmax):    3.42 cm AV Area (Vmean):   2.98 cm AV Area (VTI):     4.00 cm AV Vmax:           100.00 cm/s AV Vmean:          67.100 cm/s AV VTI:            0.158 m AV Peak Grad:      4.0 mmHg AV Mean Grad:      2.0 mmHg LVOT Vmax:         109.00 cm/s LVOT Vmean:        63.600 cm/s LVOT VTI:          0.201 m LVOT/AV VTI ratio: 1.27  AORTA Ao Root diam: 3.42 cm MITRAL VALVE                TRICUSPID VALVE MV Area (PHT): 3.36 cm     TR Peak grad:   52.1 mmHg MV Decel Time: 226 msec     TR Vmax:        361.00 cm/s MV E velocity: 89.10 cm/s MV A velocity: 113.00 cm/s  SHUNTS MV E/A ratio:  0.79         Systemic VTI:  0.20 m                             Systemic Diam: 2.00 cm Ida Rogue MD Electronically signed by Ida Rogue MD Signature Date/Time: 09/19/2022/4:45:18 PM    Final    DG Chest 2 View  Result Date: 09/19/2022 CLINICAL DATA:  Multifocal pneumonia. EXAM: CHEST - 2 VIEW COMPARISON:  One-view chest x-ray 09/02/2022 FINDINGS: Heart size is normal. Right upper lobe and bilateral lower lobes, right greater than left airspace opacities have increased. Bilateral pleural effusions have increased, right greater than left. The visualized soft tissues and bony thorax are unremarkable. IMPRESSION: Increasing right upper lobe and bilateral lower lobes, right greater than left airspace opacities and bilateral pleural effusions, right greater than left. Findings are compatible with multifocal pneumonia. Increased opacities may be due to disease progression or rehydration. Electronically Signed   By: San Morelle M.D.   On: 09/19/2022 08:53   CT Head Wo Contrast  Result Date: 09/15/2022 CLINICAL  DATA:  Mental status change, unknown cause EXAM: CT HEAD WITHOUT CONTRAST TECHNIQUE: Contiguous axial images were obtained from the base of the skull through the  vertex without intravenous contrast. RADIATION DOSE REDUCTION: This exam was performed according to the departmental dose-optimization program which includes automated exposure control, adjustment of the mA and/or kV according to patient size and/or use of iterative reconstruction technique. COMPARISON:  08/17/2022 FINDINGS: Brain: No evidence of acute infarction, hemorrhage, hydrocephalus, extra-axial collection or mass lesion/mass effect. Focal encephalomalacia in the anterior right frontal lobe at site of previously seen hemorrhagic contusion. No residual blood products are evident by CT. Patchy low-density changes within the periventricular and subcortical white matter compatible with chronic microvascular ischemic change. Mild diffuse cerebral volume loss. Vascular: No hyperdense vessel or unexpected calcification. Skull: Normal. Negative for fracture or focal lesion. Sinuses/Orbits: No acute finding. Other: None. IMPRESSION: 1. No acute intracranial abnormality. 2. Focal encephalomalacia in the anterior right frontal lobe at site of previously seen hemorrhagic contusion. No residual blood products are evident by CT. Electronically Signed   By: Davina Poke D.O.   On: 09/11/2022 13:32   DG Chest Portable 1 View  Result Date: 09/11/2022 CLINICAL DATA:  Cough and weakness. EXAM: PORTABLE CHEST 1 VIEW COMPARISON:  None Available. FINDINGS: The cardiac silhouette, mediastinal and hilar contours are within normal limits. Bilateral pulmonary infiltrates consistent with polylobar pneumonia. There are associated bilateral parapneumonic effusions. No pulmonary lesions or pneumothorax. The bony thorax is intact. IMPRESSION: Polylobar pneumonia and parapneumonic effusions. Electronically Signed   By: Marijo Sanes M.D.   On: 09/11/2022 13:12       Jaymason Ledesma M.D. [facility] 09/20/2022, 12:56 PM  Available via Epic secure chat 7am-7pm After 7 pm, please refer to night coverage provider listed on  amion.

## 2022-09-21 ENCOUNTER — Inpatient Hospital Stay: Payer: Medicare PPO

## 2022-09-21 DIAGNOSIS — R296 Repeated falls: Secondary | ICD-10-CM | POA: Diagnosis not present

## 2022-09-21 DIAGNOSIS — J189 Pneumonia, unspecified organism: Secondary | ICD-10-CM | POA: Diagnosis not present

## 2022-09-21 DIAGNOSIS — E538 Deficiency of other specified B group vitamins: Secondary | ICD-10-CM | POA: Diagnosis not present

## 2022-09-21 DIAGNOSIS — J9601 Acute respiratory failure with hypoxia: Secondary | ICD-10-CM | POA: Diagnosis not present

## 2022-09-21 LAB — RESPIRATORY PANEL BY PCR

## 2022-09-21 LAB — COMPREHENSIVE METABOLIC PANEL
ALT: 63 U/L — ABNORMAL HIGH (ref 0–44)
AST: 62 U/L — ABNORMAL HIGH (ref 15–41)
Albumin: 2.8 g/dL — ABNORMAL LOW (ref 3.5–5.0)
Alkaline Phosphatase: 91 U/L (ref 38–126)
Anion gap: 11 (ref 5–15)
BUN: 19 mg/dL (ref 8–23)
CO2: 25 mmol/L (ref 22–32)
Calcium: 7.3 mg/dL — ABNORMAL LOW (ref 8.9–10.3)
Chloride: 98 mmol/L (ref 98–111)
Creatinine, Ser: 1.07 mg/dL — ABNORMAL HIGH (ref 0.44–1.00)
GFR, Estimated: 50 mL/min — ABNORMAL LOW (ref >60)
Glucose, Bld: 125 mg/dL — ABNORMAL HIGH (ref 70–99)
Potassium: 2.5 mmol/L — CL (ref 3.5–5.1)
Sodium: 134 mmol/L — ABNORMAL LOW (ref 135–145)
Total Bilirubin: 0.9 mg/dL (ref 0.3–1.2)
Total Protein: 5.7 g/dL — ABNORMAL LOW (ref 6.5–8.1)

## 2022-09-21 LAB — CBC
HCT: 32.3 % — ABNORMAL LOW (ref 36.0–46.0)
Hemoglobin: 10.7 g/dL — ABNORMAL LOW (ref 12.0–15.0)
MCH: 28.8 pg (ref 26.0–34.0)
MCHC: 33.1 g/dL (ref 30.0–36.0)
MCV: 87.1 fL (ref 80.0–100.0)
Platelets: 291 10*3/uL (ref 150–400)
RBC: 3.71 MIL/uL — ABNORMAL LOW (ref 3.87–5.11)
RDW: 13.2 % (ref 11.5–15.5)
WBC: 15 10*3/uL — ABNORMAL HIGH (ref 4.0–10.5)
nRBC: 0 % (ref 0.0–0.2)

## 2022-09-21 LAB — BLOOD GAS, ARTERIAL
Acid-Base Excess: 1.3 mmol/L (ref 0.0–2.0)
Bicarbonate: 25.9 mmol/L (ref 20.0–28.0)
O2 Content: 15 L/min
O2 Saturation: 92.8 %
Patient temperature: 37
pCO2 arterial: 40 mmHg (ref 32–48)
pH, Arterial: 7.42 (ref 7.35–7.45)
pO2, Arterial: 62 mmHg — ABNORMAL LOW (ref 83–108)

## 2022-09-21 LAB — PROCALCITONIN: Procalcitonin: 0.39 ng/mL

## 2022-09-21 LAB — MAGNESIUM: Magnesium: 1.6 mg/dL — ABNORMAL LOW (ref 1.7–2.4)

## 2022-09-21 LAB — STREP PNEUMONIAE URINARY ANTIGEN: Strep Pneumo Urinary Antigen: NEGATIVE

## 2022-09-21 LAB — SARS CORONAVIRUS 2 BY RT PCR: SARS Coronavirus 2 by RT PCR: NEGATIVE

## 2022-09-21 LAB — MRSA NEXT GEN BY PCR, NASAL: MRSA by PCR Next Gen: NOT DETECTED

## 2022-09-21 MED ORDER — MAGNESIUM SULFATE 50 % IJ SOLN: 4.0000 g | Freq: Once | INTRAVENOUS | Status: DC

## 2022-09-21 MED ORDER — POTASSIUM CHLORIDE 10 MEQ/100ML IV SOLN
10.0000 meq | INTRAVENOUS | Status: AC
  Administered 2022-09-21 (×4): 10 meq via INTRAVENOUS
  Filled 2022-09-21 (×4): qty 100

## 2022-09-21 MED ORDER — MAGNESIUM SULFATE 4 GM/100ML IV SOLN
4.0000 g | Freq: Once | INTRAVENOUS | Status: AC
  Administered 2022-09-21: 4 g via INTRAVENOUS
  Filled 2022-09-21: qty 100

## 2022-09-21 MED ORDER — POTASSIUM CHLORIDE CRYS ER 20 MEQ PO TBCR: 40.0000 meq | EXTENDED_RELEASE_TABLET | Freq: Every day | ORAL | Status: DC

## 2022-09-21 MED ORDER — POTASSIUM CHLORIDE CRYS ER 20 MEQ PO TBCR
40.0000 meq | EXTENDED_RELEASE_TABLET | Freq: Once | ORAL | Status: AC
  Administered 2022-09-21: 40 meq via ORAL
  Filled 2022-09-21: qty 2

## 2022-09-21 MED ORDER — VANCOMYCIN HCL IN DEXTROSE 1-5 GM/200ML-% IV SOLN
1000.0000 mg | INTRAVENOUS | Status: DC
  Filled 2022-09-21: qty 200

## 2022-09-21 MED ORDER — FUROSEMIDE 10 MG/ML IJ SOLN
40.0000 mg | Freq: Two times a day (BID) | INTRAMUSCULAR | Status: DC
  Administered 2022-09-21 (×2): 40 mg via INTRAVENOUS
  Filled 2022-09-21 (×2): qty 4

## 2022-09-21 MED ORDER — METHYLPREDNISOLONE SODIUM SUCC 125 MG IJ SOLR
125.0000 mg | Freq: Two times a day (BID) | INTRAMUSCULAR | Status: DC
  Administered 2022-09-21: 125 mg via INTRAVENOUS
  Filled 2022-09-21: qty 2

## 2022-09-21 MED ORDER — SODIUM CHLORIDE 1 G PO TABS
1.0000 g | ORAL_TABLET | Freq: Two times a day (BID) | ORAL | Status: DC
  Filled 2022-09-21: qty 1

## 2022-09-21 NOTE — Progress Notes (Signed)
Went in to check patient's oxygen and on 13L HFNC oxygen saturation was in the mid 70's. After patient's oxygen was increased to 15L HFNC oxygen saturation was 81%. Patient placed on heated high flow nasal cannula at 50L, 85% oxygen saturation 93%; RN and Doctor notified.

## 2022-09-21 NOTE — Consult Note (Signed)
Pharmacy Antibiotic Note  Cindy Reed is a 88 y.o. female admitted on 09/02/2022 with pneumonia.  Pharmacy has been consulted for cefepime and vancomycin dosing.  Plan: Start cefepime 2 g q12H.   Vancomycin 1000 mg q48H for a predicted AUC of 454.3. Goal AUC of 400-550. Vd 0.72, IBW, Scr 1.06. Plan to obtain vancomycin levels in after 4th or 5th dose. Will order a MRSA PCR.   Height: 5\' 1"  (154.9 cm) Weight: 57.2 kg (126 lb 1.7 oz) IBW/kg (Calculated) : 47.8  Temp (24hrs), Avg:98.1 F (36.7 C), Min:97.5 F (36.4 C), Max:98.9 F (37.2 C)  Recent Labs  Lab 09/03/2022 1158 09/19/22 0459 09/20/22 0556 09/21/22 0512  WBC 9.3 10.5  --  15.0*  CREATININE 1.00 0.79 0.81 1.07*     Estimated Creatinine Clearance: 26.9 mL/min (A) (by C-G formula based on SCr of 1.07 mg/dL (H)).    No Known Allergies  Antimicrobials this admission: 10/20 cefepime >>  10/20 vancomycin >>   Dose adjustments this admission: Adjusted from Vanc 750mg  Q24h ro 1000mg  Q28h d/t worsening renal function.  Microbiology results: 10/21 MRSA PCR: negative 10/21 Resp panel: pending 10/21 Cdiff quick scan: negative  Thank you for allowing pharmacy to be a part of this patient's care.  Pearla Dubonnet, PharmD Clinical Pharmacist 09/21/2022 1:30 PM

## 2022-09-21 NOTE — Progress Notes (Signed)
Patient remains on HFNC in no distress.  Hands and feet cold however able to pu sat reading of 90% after multiple tries. O2 set at 94% and 50L.  Pending transfer to icu

## 2022-09-21 NOTE — Consult Note (Signed)
[facility] Consultation      Date: 09/21/2022,   MRN# [id number] Cindy Reed 09/27/1931  Cindy Reed is a 88 y.o. old female seen in consultation for dyspnea at the request of Dr. Tana Coast.     CHIEF COMPLAINT:   Dyspnea   HISTORY OF [facility] course per hospitalist: Patient is a 88 year old female with depression, anxiety, A-fib on aspirin, amiodarone, hypothyroidism, recent history of traumatic SAH presented to ED with falling and weakness.  Patient was alert and oriented, reported that she slowly collapsed against the close abdominal with her back on her gluteal, otherwise did not hit her head or loss of consciousness.  She just felt very weak. Vital signs stable, however sats 88% on room air that improved to 92% on 2 L O2 via [place] COVID-19 negative, influenza negative TSH 3.1, CK1 53 Sodium 126  Spoke at length with daughters. Patient is on optiflow and struggling to breathe. Patient is a very pleasant retired Pharmacist, hospital. She has no exposures to bird or occupational conditions. She is a never smoker and grew up around [place]. She was doing relatively ok. Endorsing some poor appetite. By Wednesday, daughters grew concerned because the patient was becoming increasingly fatigued. Since being admitted, patient has been treated for multifocal pna. Echo shows DD, elevated PASP, but normal EF. Pro BNP elevated. CT showing BL pleural effusions.   Since this morning, patient has had dramatically increasing oxygen requirements.    PAST MEDICAL HISTORY   Past Medical History:  Diagnosis Date   Hypertension    Osteoporosis    Scoliosis      SURGICAL HISTORY   Past Surgical History:  Procedure Laterality Date   CATARACT EXTRACTION W/PHACO Left 07/26/2020   Procedure: CATARACT EXTRACTION PHACO AND INTRAOCULAR LENS PLACEMENT (IOC) LEFT MALYUGIN 5.78 01:12.2 8.0%;  Surgeon: Leandrew Koyanagi, MD;  Location: [facility];  Service: Ophthalmology;   Laterality: Left;   CATARACT EXTRACTION W/PHACO Right 08/16/2020   Procedure: CATARACT EXTRACTION PHACO AND INTRAOCULAR LENS PLACEMENT (IOC) RIGHT MALYUGIN 7.26 01:05.8 11.0%;  Surgeon: Leandrew Koyanagi, MD;  Location: [facility];  Service: Ophthalmology;  Laterality: Right;   DILATION AND CURETTAGE OF UTERUS     TONSILLECTOMY     TUBAL LIGATION       FAMILY HISTORY   Family History  Problem Relation Age of Onset   Breast cancer Neg Hx      SOCIAL HISTORY   Social History   Tobacco Use   Smoking status: Never   Smokeless tobacco: Never  Vaping Use   Vaping Use: Never used  Substance Use Topics   Alcohol use: Not Currently     MEDICATIONS    Home Medication:    Current Medication:  Current Facility-Administered Medications:    (feeding supplement) PROSource Plus liquid 30 mL, 30 mL, Oral, TID BM, Rai, Ripudeep K, MD, 30 mL at 09/21/22 1305   acetaminophen (TYLENOL) tablet 650 mg, 650 mg, Oral, Q6H PRN, 650 mg at 09/21/22 1310 **OR** acetaminophen (TYLENOL) suppository 650 mg, 650 mg, Rectal, Q6H PRN, Cox, Amy N, DO   amiodarone (PACERONE) tablet 200 mg, 200 mg, Oral, Daily, Cox, Amy N, DO, 200 mg at 09/21/22 1027   aspirin EC tablet 81 mg, 81 mg, Oral, Daily, Cox, Amy N, DO, 81 mg at 09/21/22 1027   benzonatate (TESSALON) capsule 100 mg, 100 mg, Oral, TID, Rai, Ripudeep K, MD, 100 mg at 09/21/22 1533   ceFEPIme (MAXIPIME) 2 g in  sodium chloride 0.9 % 100 mL IVPB, 2 g, Intravenous, Q12H, Rai, Ripudeep K, MD, Stopped at 09/21/22 1245   cholecalciferol (VITAMIN D3) 25 MCG (1000 UNIT) tablet 1,000 Units, 1,000 Units, Oral, Daily, Cox, Amy N, DO, 1,000 Units at 09/21/22 1027   cyanocobalamin (VITAMIN B12) tablet 500 mcg, 500 mcg, Oral, Daily, Cox, Amy N, DO, 500 mcg at 09/21/22 1026   feeding supplement (ENSURE ENLIVE / ENSURE PLUS) liquid 237 mL, 237 mL, Oral, BID BM, Rai, Ripudeep K, MD, 237 mL at 09/21/22 1305   furosemide (LASIX) injection 40 mg, 40 mg,  Intravenous, Q12H, Rai, Ripudeep K, MD, 40 mg at 09/21/22 1133   guaiFENesin (MUCINEX) 12 hr tablet 600 mg, 600 mg, Oral, BID, Rai, Ripudeep K, MD, 600 mg at 09/21/22 1027   guaiFENesin-dextromethorphan (ROBITUSSIN DM) 100-10 MG/5ML syrup 5 mL, 5 mL, Oral, Q4H PRN, Rai, Ripudeep K, MD, 5 mL at 09/19/22 1506   levothyroxine (SYNTHROID) tablet 50 mcg, 50 mcg, Oral, Q0600, Jaynie Bream, RPH, 50 mcg at 09/21/22 6387   mirtazapine (REMERON) tablet 7.5 mg, 7.5 mg, Oral, QHS, Rai, Ripudeep K, MD, 7.5 mg at 09/20/22 2153   multivitamin with minerals tablet 1 tablet, 1 tablet, Oral, Daily, Rai, Ripudeep K, MD, 1 tablet at 09/21/22 1027   ondansetron (ZOFRAN) tablet 4 mg, 4 mg, Oral, Q6H PRN **OR** ondansetron (ZOFRAN) injection 4 mg, 4 mg, Intravenous, Q6H PRN, Cox, Amy N, DO, 4 mg at 09/20/22 1712   [START ON 09/22/2022] potassium chloride SA (KLOR-CON M) CR tablet 40 mEq, 40 mEq, Oral, Daily, Rai, Ripudeep K, MD   senna-docusate (Senokot-S) tablet 1 tablet, 1 tablet, Oral, QHS PRN, Cox, Amy N, DO   [START ON 09/22/2022] sodium chloride tablet 1 g, 1 g, Oral, BID WC, Rai, Ripudeep K, MD   [COMPLETED] vancomycin (VANCOREADY) IVPB 1500 mg/300 mL, 1,500 mg, Intravenous, Once, Stopped at 09/20/22 2047 **FOLLOWED BY** [START ON 09/22/2022] vancomycin (VANCOCIN) IVPB 1000 mg/200 mL premix, 1,000 mg, Intravenous, Q48H, Nazari, Walid A, RPH    ALLERGIES   Patient has no known allergies.     REVIEW OF SYSTEMS    Review of Systems:  Gen:  Denies  fever, sweats, chills weigh loss  HEENT: Denies blurred vision, double vision, ear pain, eye pain, hearing loss, nose bleeds, sore throat Cardiac:  No dizziness, chest pain or heaviness, chest tightness,edema Resp:   Denies cough or sputum porduction, shortness of breath,wheezing, hemoptysis,  Gi: Denies swallowing difficulty, stomach pain, nausea or vomiting, diarrhea, constipation, bowel incontinence Gu:  Denies bladder incontinence, burning urine Ext:    Denies Joint pain, stiffness or swelling Skin: Denies  skin rash, easy bruising or bleeding or hives Endoc:  Denies polyuria, polydipsia , polyphagia or weight change Psych:   Denies depression, insomnia or hallucinations   Other:  All other systems negative   VS: BP (!) 110/54 (BP Location: Left Arm)   Pulse 77   Temp 97.8 F (36.6 C) (Oral)   Resp (!) 31   Ht 5\' 1"  (1.549 m)   Wt 57.2 kg   SpO2 (!) 78%   BMI 23.83 kg/m      PHYSICAL EXAM  General Appearance: No distress  EYES PERRLA, EOM intact.   NECK Supple, No JVD Pulmonary: Coarse BS bilaterally CardiovascularNormal S1,S2.  No m/r/g.   Abdomen: Benign, Soft, non-tender. Skin:   warm, no rashes, no ecchymosis  Extremities: normal, no cyanosis, clubbing. Neuro:without focal findings,  speech normal  PSYCHIATRIC: Mood, affect within normal limits.  ALL OTHER ROS ARE NEGATIVE      IMAGING    US Venous Img Lower Bilateral (DVT)  Result Date: 09/21/2022 CLINICAL DATA:  Edema EXAM: BILATERAL LOWER EXTREMITY VENOUS DOPPLER ULTRASOUND TECHNIQUE: Gray-scale sonography with graded compression, as well as color Doppler and duplex ultrasound were performed to evaluate the lower extremity deep venous systems from the level of the common femoral vein and including the common femoral, femoral, profunda femoral, popliteal and calf veins including the posterior tibial, peroneal and gastrocnemius veins when visible. The superficial great saphenous vein was also interrogated. Spectral Doppler was utilized to evaluate flow at rest and with distal augmentation maneuvers in the common femoral, femoral and popliteal veins. COMPARISON:  None Available. FINDINGS: RIGHT LOWER EXTREMITY Common Femoral Vein: No evidence of thrombus. Normal compressibility, respiratory phasicity and response to augmentation. Saphenofemoral Junction: No evidence of thrombus. Normal compressibility and flow on color Doppler imaging. Profunda Femoral Vein: No  evidence of thrombus. Normal compressibility and flow on color Doppler imaging. Femoral Vein: No evidence of thrombus. Normal compressibility, respiratory phasicity and response to augmentation. Popliteal Vein: No evidence of thrombus. Normal compressibility, respiratory phasicity and response to augmentation. Calf Veins: No evidence of thrombus. Normal compressibility and flow on color Doppler imaging. LEFT LOWER EXTREMITY Common Femoral Vein: No evidence of thrombus. Normal compressibility, respiratory phasicity and response to augmentation. Saphenofemoral Junction: No evidence of thrombus. Normal compressibility and flow on color Doppler imaging. Profunda Femoral Vein: No evidence of thrombus. Normal compressibility and flow on color Doppler imaging. Femoral Vein: No evidence of thrombus. Normal compressibility, respiratory phasicity and response to augmentation. Popliteal Vein: No evidence of thrombus. Normal compressibility, respiratory phasicity and response to augmentation. Calf Veins: No evidence of thrombus. Normal compressibility and flow on color Doppler imaging. Other Findings:  None. IMPRESSION: No evidence of deep venous thrombosis in either lower extremity. Electronically Signed   By: Albin Felling M.D.   On: 09/21/2022 12:42   CT Angio Chest Pulmonary Embolism (PE) W or WO Contrast  Result Date: 09/20/2022 CLINICAL DATA:  Pulmonary embolism (PE) suspected, high prob EXAM: CT ANGIOGRAPHY CHEST WITH CONTRAST TECHNIQUE: Multidetector CT imaging of the chest was performed using the standard protocol during bolus administration of intravenous contrast. Multiplanar CT image reconstructions and MIPs were obtained to evaluate the vascular anatomy. RADIATION DOSE REDUCTION: This exam was performed according to the departmental dose-optimization program which includes automated exposure control, adjustment of the mA and/or kV according to patient size and/or use of iterative reconstruction technique.  CONTRAST:  11mL OMNIPAQUE IOHEXOL 350 MG/ML SOLN COMPARISON:  X-ray 09/19/2022 FINDINGS: Cardiovascular: Satisfactory opacification of the pulmonary arteries to the segmental level. No evidence of pulmonary embolism. Thoracic aorta is nonaneurysmal. Scattered atherosclerotic vascular calcifications of the aorta and coronary arteries. Normal heart size. Trace pericardial effusion. Mediastinum/Nodes: No enlarged mediastinal, hilar, or axillary lymph nodes. Thyroid gland, trachea, and esophagus demonstrate no significant findings. Lungs/Pleura: Moderate layering bilateral pleural effusions. Right greater than left bibasilar atelectasis. Widespread ground-glass opacities bilaterally with more confluent airspace consolidation in the right upper lobe. No pneumothorax. Upper Abdomen: Renal sinus cyst versus hydronephrosis involving the left kidney, which is atrophic. Small hiatal hernia. Musculoskeletal: Scoliotic thoracolumbar curvature. No acute bony or chest wall findings. Review of the MIP images confirms the above findings. IMPRESSION: 1. Negative for pulmonary embolism. 2. Widespread ground-glass opacities bilaterally with more confluent airspace consolidation in the right upper lobe, suggestive of pulmonary edema. Superimposed pneumonia not excluded. 3. Moderate layering bilateral pleural effusions with right greater  than left bibasilar atelectasis. 4. Trace pericardial effusion. 5. Renal sinus cyst versus hydronephrosis involving the left kidney, which is atrophic. Dedicated renal ultrasound recommended for further assessment. 6. Small hiatal hernia. 7. Aortic and coronary artery atherosclerosis (ICD10-I70.0). Electronically Signed   By: Davina Poke D.O.   On: 09/20/2022 11:09   ECHOCARDIOGRAM COMPLETE  Result Date: 09/19/2022    ECHOCARDIOGRAM REPORT   Patient Name:   Cindy Reed Date of Exam: 09/19/2022 Medical Rec #:  [id number]  Height:       61.0 in Accession #:    [id number] Weight:       111.1 lb  Date of Birth:  03/23/1931  BSA:          1.471 m Patient Age:    64 years   BP:           149/66 mmHg Patient Gender: F          HR:           88 bpm. Exam Location:  [facility] Procedure: 2D Echo, Color Doppler and Cardiac Doppler Indications:     Dyspnea R06.00  History:         Patient has no prior history of Echocardiogram examinations.                  Risk Factors:Hypertension.  Sonographer:     Sherrie Sport Referring Phys:  [id number] RIPUDEEP K RAI Diagnosing Phys: Ida Rogue MD  Sonographer Comments: Image quality was good. IMPRESSIONS  1. Left ventricular ejection fraction, by estimation, is 60 to 65%. The left ventricle has normal function. The left ventricle has no regional wall motion abnormalities. Left ventricular diastolic parameters are consistent with Grade I diastolic dysfunction (impaired relaxation).  2. Right ventricular systolic function is normal. The right ventricular size is normal. There is moderately elevated pulmonary artery systolic pressure. The estimated right ventricular systolic pressure is 0000000 mmHg.  3. The mitral valve is normal in structure. Mild mitral valve regurgitation. No evidence of mitral stenosis. There is mild late systolic prolapse of both leaflets of the mitral valve.  4. Tricuspid valve regurgitation is moderate.  5. The aortic valve is normal in structure. Aortic valve regurgitation is mild. Aortic valve sclerosis is present, with no evidence of aortic valve stenosis.  6. The inferior vena cava is normal in size with greater than 50% respiratory variability, suggesting right atrial pressure of 3 mmHg. FINDINGS  Left Ventricle: Left ventricular ejection fraction, by estimation, is 60 to 65%. The left ventricle has normal function. The left ventricle has no regional wall motion abnormalities. The left ventricular internal cavity size was normal in size. There is  no left ventricular hypertrophy. Left ventricular diastolic parameters are consistent with Grade I diastolic  dysfunction (impaired relaxation). Right Ventricle: The right ventricular size is normal. No increase in right ventricular wall thickness. Right ventricular systolic function is normal. There is moderately elevated pulmonary artery systolic pressure. The tricuspid regurgitant velocity is 3.61 m/s, and with an assumed right atrial pressure of 5 mmHg, the estimated right ventricular systolic pressure is 0000000 mmHg. Left Atrium: Left atrial size was normal in size. Right Atrium: Right atrial size was normal in size. Pericardium: There is no evidence of pericardial effusion. Mitral Valve: The mitral valve is normal in structure. There is mild late systolic prolapse of both leaflets of the mitral valve. There is moderate thickening of the mitral valve leaflet(s). Mild mitral valve regurgitation. No evidence of mitral valve stenosis. Tricuspid  Valve: The tricuspid valve is normal in structure. Tricuspid valve regurgitation is moderate . No evidence of tricuspid stenosis. Aortic Valve: The aortic valve is normal in structure. Aortic valve regurgitation is mild. Aortic valve sclerosis is present, with no evidence of aortic valve stenosis. Aortic valve mean gradient measures 2.0 mmHg. Aortic valve peak gradient measures 4.0  mmHg. Aortic valve area, by VTI measures 4.00 cm. Pulmonic Valve: The pulmonic valve was normal in structure. Pulmonic valve regurgitation is not visualized. No evidence of pulmonic stenosis. Aorta: The aortic root is normal in size and structure. Venous: The inferior vena cava is normal in size with greater than 50% respiratory variability, suggesting right atrial pressure of 3 mmHg. IAS/Shunts: No atrial level shunt detected by color flow Doppler.  LEFT VENTRICLE PLAX 2D LVIDd:         3.70 cm   Diastology LVIDs:         2.30 cm   LV e' medial:    5.87 cm/s LV PW:         1.00 cm   LV E/e' medial:  15.2 LV IVS:        0.80 cm   LV e' lateral:   8.05 cm/s LVOT diam:     2.00 cm   LV E/e' lateral: 11.1  LV SV:         63 LV SV Index:   43 LVOT Area:     3.14 cm  RIGHT VENTRICLE RV Basal diam:  2.70 cm RV Mid diam:    2.30 cm LEFT ATRIUM             Index        RIGHT ATRIUM           Index LA diam:        2.70 cm 1.84 cm/m   RA Area:     14.90 cm LA Vol (A2C):   25.7 ml 17.47 ml/m  RA Volume:   36.30 ml  24.67 ml/m LA Vol (A4C):   29.7 ml 20.19 ml/m LA Biplane Vol: 27.8 ml 18.90 ml/m  AORTIC VALVE AV Area (Vmax):    3.42 cm AV Area (Vmean):   2.98 cm AV Area (VTI):     4.00 cm AV Vmax:           100.00 cm/s AV Vmean:          67.100 cm/s AV VTI:            0.158 m AV Peak Grad:      4.0 mmHg AV Mean Grad:      2.0 mmHg LVOT Vmax:         109.00 cm/s LVOT Vmean:        63.600 cm/s LVOT VTI:          0.201 m LVOT/AV VTI ratio: 1.27  AORTA Ao Root diam: 3.42 cm MITRAL VALVE                TRICUSPID VALVE MV Area (PHT): 3.36 cm     TR Peak grad:   52.1 mmHg MV Decel Time: 226 msec     TR Vmax:        361.00 cm/s MV E velocity: 89.10 cm/s MV A velocity: 113.00 cm/s  SHUNTS MV E/A ratio:  0.79         Systemic VTI:  0.20 m  Systemic Diam: 2.00 cm Ida Rogue MD Electronically signed by Ida Rogue MD Signature Date/Time: 09/19/2022/4:45:18 PM    Final    DG Chest 2 View  Result Date: 09/19/2022 CLINICAL DATA:  Multifocal pneumonia. EXAM: CHEST - 2 VIEW COMPARISON:  One-view chest x-ray 09/22/2022 FINDINGS: Heart size is normal. Right upper lobe and bilateral lower lobes, right greater than left airspace opacities have increased. Bilateral pleural effusions have increased, right greater than left. The visualized soft tissues and bony thorax are unremarkable. IMPRESSION: Increasing right upper lobe and bilateral lower lobes, right greater than left airspace opacities and bilateral pleural effusions, right greater than left. Findings are compatible with multifocal pneumonia. Increased opacities may be due to disease progression or rehydration. Electronically Signed   By:  San Morelle M.D.   On: 09/19/2022 08:53   CT Head Wo Contrast  Result Date: 09/02/2022 CLINICAL DATA:  Mental status change, unknown cause EXAM: CT HEAD WITHOUT CONTRAST TECHNIQUE: Contiguous axial images were obtained from the base of the skull through the vertex without intravenous contrast. RADIATION DOSE REDUCTION: This exam was performed according to the departmental dose-optimization program which includes automated exposure control, adjustment of the mA and/or kV according to patient size and/or use of iterative reconstruction technique. COMPARISON:  08/17/2022 FINDINGS: Brain: No evidence of acute infarction, hemorrhage, hydrocephalus, extra-axial collection or mass lesion/mass effect. Focal encephalomalacia in the anterior right frontal lobe at site of previously seen hemorrhagic contusion. No residual blood products are evident by CT. Patchy low-density changes within the periventricular and subcortical white matter compatible with chronic microvascular ischemic change. Mild diffuse cerebral volume loss. Vascular: No hyperdense vessel or unexpected calcification. Skull: Normal. Negative for fracture or focal lesion. Sinuses/Orbits: No acute finding. Other: None. IMPRESSION: 1. No acute intracranial abnormality. 2. Focal encephalomalacia in the anterior right frontal lobe at site of previously seen hemorrhagic contusion. No residual blood products are evident by CT. Electronically Signed   By: Davina Poke D.O.   On: 09/21/2022 13:32   DG Chest Portable 1 View  Result Date: 09/17/2022 CLINICAL DATA:  Cough and weakness. EXAM: PORTABLE CHEST 1 VIEW COMPARISON:  None Available. FINDINGS: The cardiac silhouette, mediastinal and hilar contours are within normal limits. Bilateral pulmonary infiltrates consistent with polylobar pneumonia. There are associated bilateral parapneumonic effusions. No pulmonary lesions or pneumothorax. The bony thorax is intact. IMPRESSION: Polylobar pneumonia  and parapneumonic effusions. Electronically Signed   By: Marijo Sanes M.D.   On: 09/19/2022 13:12      ASSESSMENT/PLAN    88 year old female with no smoking history who presented with fatigue and now presenting with abnormal imaging. I can't find old axial images of her chest. I wonder when this all developed. She was appropriately treated for multifocal pneumonia. She has BL pleural effusions which would seem c/w the elevated pro BNP. But her Ef is normal. She does have some DD and elevated PASP. Does she have [place]? And what is the [facility] related to? Regardless, she has not responded to the more aggressive diuresis in the last 12 hours. Sure, it looks like fluid. But I see some traction bronchiectasis, mosaic attenuation, and some GGOs. I wonder if she has some ILD? Or something underneath the fluid. The family is reasonable and wants to be aggressive. But appropriately so. Since she was doing so well and this is an abrupt departure to her normal health and functional status.   I worry that she may require MV shortly.   - agree with transfer to ICU  as stepdown - follow up abg - titrate optiflow - we will follow along with you - HRCT ordered to further typify and d/dx DPLD or ILD - SDS ordered for possible parenchymal lung disease - continue diuresis - continue abx per primary team - daughters and patient agree that she is full code and would be ok with a time-limited trial of intubation/MV - make patient NPO        MEDICATION ADJUSTMENTS/LABS AND TESTS ORDERED:    CURRENT MEDICATIONS REVIEWED AT [facility]   Patient  satisfied with Plan of action and management. All questions answered  Follow up   Total Time Spent  85 minutes   Arcelia Jew MD PCCM

## 2022-09-21 NOTE — Progress Notes (Addendum)
[facility]                                                                              Cindy Reed, is a 88 y.o. female, DOB - 03-02-1931, [id number] Admit date - 09/10/2022    Outpatient Primary MD for the patient is [name], MD  LOS - 3  days  Chief Complaint  Patient presents with   Weakness   Fall       Brief summary   Patient is a 88 year old female with depression, anxiety, A-fib on aspirin, amiodarone, hypothyroidism, recent history of traumatic SAH presented to ED with falling and weakness.  Patient was alert and oriented, reported that she slowly collapsed against the close abdominal with her back on her gluteal, otherwise did not hit her head or loss of consciousness.  She just felt very weak. Vital signs stable, however sats 88% on room air that improved to 92% on 2 L O2 via [place] COVID-19 negative, influenza negative TSH 3.1, CK1 53 Sodium 126  Assessment & Plan    Principal Problem: Acute respiratory failure with hypoxia due to multifocal pneumonia, bilateral pleural effusions, pulmonary edema -O2 sats 88% on room air in ED, Procalcitonin 0.17 -> 0.28  --BNP 370, received Lasix 20 mg IV x1 on 10/19, was placed on 9 L HFNC -CTA chest showed no PE however has bilateral layering pleural effusions with pulmonary edema and right upper lobe consolidation. -Patient was placed on Lasix 40 mg IV daily, received extra 20 mg IV dose on 10/20 (received total of 60 mg Lasix on 10/20)  --Continues to have hypoxia, O2 escalated to 13 L HFNC -Given escalation of O2 requirements, placed on Lasix IV 40 mg every 12 hours, requested pulmonology consult, d/w Dr Gwendalyn Ege -Continue IV vancomycin and cefepime -Repeat COVID test, added respiratory virus panel  -urine Legionella antigen, urine strep antigen   Active Problems: Acute diastolic CHF exacerbation with pulmonary edema -2D echo showed EF of 60 to 65%, G1 DD, moderately elevated PA pressure,  57.1 -Continue Lasix 40 mg IV every 12 hours - strict I's and O's and daily weights -Doppler ultrasound lower extremities to rule out DVT, (Lt >Rt edema, per daughter, she has chronic edema on the right)  Hypokalemia, hypomagnesemia -Potassium 2.5, magnesium 1.6  -replaced  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  Sodium 129 on 9/16, then trended down to 125 on 10/19 -Serum osmolality 264, urine osmolality 510, urine sodium 17, consistent with SIADH -Improving, salt tabs decreased to 1 g twice daily  -Hold fluoxetine   Hypertension -BP stable HCTZ discontinued    Paroxysmal atrial fibrillation (HCC) -Rate controlled, continue amiodarone -Not on any anticoagulation due to recent [facility] and falls. -Current on aspirin 81 mg daily, will continue    Stage 3a chronic kidney disease ([place]) -Creatinine at baseline, 1.0  -Creatinine currently at baseline, continue to monitor    Frequent falls with generalized weakness  -PT OT evaluation    Hypothyroidism -Continue Synthroid  History of B12 deficiency -Continue B12 replacement  Nutrition -Per patient she  does not feel like eating, poor appetite.  Had a vomiting episode today -Nutritional consult, placed on Ensure twice daily, Remeron 7.5 mg daily at bedtime  Code Status: Full code DVT Prophylaxis:  Place TED hose Start: 09/02/2022 1441   Level of Care: Level of care: Med-Surg Family Communication: Updated patient's 2 daughters at the bedside today   Disposition Plan:      Remains inpatient appropriate: Work-up in progress, transfer to stepdown unit due to escalated requirements for O2   Procedures:  None  Consultants:   None  Antimicrobials:   Anti-infectives (From admission, onward)    Start     Dose/Rate Route Frequency Ordered Stop   09/21/22 1800  vancomycin (VANCOREADY) IVPB 750 mg/150 mL       See Hyperspace for full Linked Orders Report.    750 mg 150 mL/hr over 60 Minutes Intravenous Every 24 hours 09/20/22 1752     09/20/22 1845  vancomycin (VANCOREADY) IVPB 1500 mg/300 mL       See Hyperspace for full Linked Orders Report.   1,500 mg 150 mL/hr over 120 Minutes Intravenous  Once 09/20/22 1752 09/21/22 0617   09/20/22 1845  ceFEPIme (MAXIPIME) 2 g in sodium chloride 0.9 % 100 mL IVPB        2 g 200 mL/hr over 30 Minutes Intravenous Every 12 hours 09/20/22 1752     09/14/2022 1600  azithromycin (ZITHROMAX) 500 mg in sodium chloride 0.9 % 250 mL IVPB  Status:  Discontinued        500 mg 250 mL/hr over 60 Minutes Intravenous Every 24 hours 09/01/2022 1442 09/20/22 1715   09/11/2022 1500  cefTRIAXone (ROCEPHIN) 2 g in sodium chloride 0.9 % 100 mL IVPB  Status:  Discontinued        2 g 200 mL/hr over 30 Minutes Intravenous Every 24 hours 09/15/2022 1442 09/20/22 1715   09/01/2022 1415  cefTRIAXone (ROCEPHIN) 2 g in sodium chloride 0.9 % 100 mL IVPB  Status:  Discontinued        2 g 200 mL/hr over 30 Minutes Intravenous  Once 09/05/2022 1410 09/16/2022 1442   09/04/2022 1415  azithromycin (ZITHROMAX) 500 mg in sodium chloride 0.9 % 250 mL IVPB  Status:  Discontinued        500 mg 250 mL/hr over 60 Minutes Intravenous  Once 09/30/2022 1410 09/07/2022 1442          Medications  (feeding supplement) PROSource Plus  30 mL Oral TID BM   amiodarone  200 mg Oral Daily   aspirin EC  81 mg Oral Daily   benzonatate  100 mg Oral TID   vitamin D3  1,000 Units Oral Daily   cyanocobalamin  500 mcg Oral Daily   feeding supplement  237 mL Oral BID BM   furosemide  40 mg Intravenous Q12H   guaiFENesin  600 mg Oral BID   levothyroxine  50 mcg Oral Q0600   mirtazapine  7.5 mg Oral QHS   multivitamin with minerals  1 tablet Oral Daily   [START ON 09/22/2022] potassium chloride  40 mEq Oral Daily   sodium chloride  1 g Oral TID WC      Subjective:   Cindy Reed was seen and examined today.  O2 requirements escalated this morning, placed on 13 L  HFNC.  No fevers or chills.  No chest pain, nausea or vomiting.    Objective:   Vitals:   09/20/22 2100 09/21/22 0500 09/21/22 0735 09/21/22 [id number]  BP: (!) 119/52  (!) 104/49   Pulse: 77  87   Resp: 18  (!) 22   Temp: 98.9 F (37.2 C)  97.8 F (36.6 C)   TempSrc:      SpO2: 93%  92% 92%  Weight:  57.2 kg    Height:        Intake/Output Summary (Last 24 hours) at 09/21/2022 1258 Last data filed at 09/21/2022 1246 Gross per 24 hour  Intake 1198.37 ml  Output 1600 ml  Net -401.63 ml     Wt Readings from Last 3 Encounters:  09/21/22 57.2 kg  08/16/22 50.4 kg  08/16/20 50.3 kg   Physical Exam General: Alert and oriented x 3, NAD Cardiovascular: S1 S2 clear, RRR.  Respiratory: Bibasilar crackles Gastrointestinal: Soft, nontender, nondistended, NBS Ext: LLE 1+, RLE no pedal edema  Neuro: no new deficits Skin: No rashes Psych: Normal affect and demeanor, alert and oriented x3    Data Reviewed:  I have personally reviewed following labs    CBC Lab Results  Component Value Date   WBC 15.0 (H) 09/21/2022   RBC 3.71 (L) 09/21/2022   HGB 10.7 (L) 09/21/2022   HCT 32.3 (L) 09/21/2022   MCV 87.1 09/21/2022   MCH 28.8 09/21/2022   PLT 291 09/21/2022   MCHC 33.1 09/21/2022   RDW 13.2 09/21/2022   LYMPHSABS 0.2 (L) 09/16/2022   MONOABS 1.1 (H) 09/15/2022   EOSABS 0.0 09/29/2022   BASOSABS 0.0 [id number]     Last metabolic panel Lab Results  Component Value Date   NA 134 (L) 09/21/2022   K 2.5 (LL) 09/21/2022   CL 98 09/21/2022   CO2 25 09/21/2022   BUN 19 09/21/2022   CREATININE 1.07 (H) 09/21/2022   GLUCOSE 125 (H) 09/21/2022   GFRNONAA 50 (L) 09/21/2022   CALCIUM 7.3 (L) 09/21/2022   PHOS 1.7 (L) 09/04/2022   PROT 5.7 (L) 09/21/2022   ALBUMIN 2.8 (L) 09/21/2022   BILITOT 0.9 09/21/2022   ALKPHOS 91 09/21/2022   AST 62 (H) 09/21/2022   ALT 63 (H) 09/21/2022   ANIONGAP 11 09/21/2022    CBG (last 3)  No results for input(s): "GLUCAP" in the last 72  hours.    Coagulation Profile: No results for input(s): "INR", "PROTIME" in the last 168 hours.   Radiology Studies: I have personally reviewed the imaging studies  US Venous Img Lower Bilateral (DVT)  Result Date: 09/21/2022 CLINICAL DATA:  Edema EXAM: BILATERAL LOWER EXTREMITY VENOUS DOPPLER ULTRASOUND TECHNIQUE: Gray-scale sonography with graded compression, as well as color Doppler and duplex ultrasound were performed to evaluate the lower extremity deep venous systems from the level of the common femoral vein and including the common femoral, femoral, profunda femoral, popliteal and calf veins including the posterior tibial, peroneal and gastrocnemius veins when visible. The superficial great saphenous vein was also interrogated. Spectral Doppler was utilized to evaluate flow at rest and with distal augmentation maneuvers in the common femoral, femoral and popliteal veins. COMPARISON:  None Available. FINDINGS: RIGHT LOWER EXTREMITY Common Femoral Vein: No evidence of thrombus. Normal compressibility, respiratory phasicity and response to augmentation. Saphenofemoral Junction: No evidence of thrombus. Normal compressibility and flow on color Doppler imaging. Profunda Femoral Vein: No evidence of thrombus. Normal compressibility and flow on color Doppler imaging. Femoral Vein: No evidence of thrombus. Normal compressibility, respiratory phasicity and response to augmentation. Popliteal Vein: No evidence of thrombus. Normal compressibility, respiratory phasicity and response to augmentation. Calf Veins: No evidence of thrombus.  Normal compressibility and flow on color Doppler imaging. LEFT LOWER EXTREMITY Common Femoral Vein: No evidence of thrombus. Normal compressibility, respiratory phasicity and response to augmentation. Saphenofemoral Junction: No evidence of thrombus. Normal compressibility and flow on color Doppler imaging. Profunda Femoral Vein: No evidence of thrombus. Normal compressibility  and flow on color Doppler imaging. Femoral Vein: No evidence of thrombus. Normal compressibility, respiratory phasicity and response to augmentation. Popliteal Vein: No evidence of thrombus. Normal compressibility, respiratory phasicity and response to augmentation. Calf Veins: No evidence of thrombus. Normal compressibility and flow on color Doppler imaging. Other Findings:  None. IMPRESSION: No evidence of deep venous thrombosis in either lower extremity. Electronically Signed   By: Albin Felling M.D.   On: 09/21/2022 12:42   CT Angio Chest Pulmonary Embolism (PE) W or WO Contrast  Result Date: 09/20/2022 CLINICAL DATA:  Pulmonary embolism (PE) suspected, high prob EXAM: CT ANGIOGRAPHY CHEST WITH CONTRAST TECHNIQUE: Multidetector CT imaging of the chest was performed using the standard protocol during bolus administration of intravenous contrast. Multiplanar CT image reconstructions and MIPs were obtained to evaluate the vascular anatomy. RADIATION DOSE REDUCTION: This exam was performed according to the departmental dose-optimization program which includes automated exposure control, adjustment of the mA and/or kV according to patient size and/or use of iterative reconstruction technique. CONTRAST:  18mL OMNIPAQUE IOHEXOL 350 MG/ML SOLN COMPARISON:  X-ray 09/19/2022 FINDINGS: Cardiovascular: Satisfactory opacification of the pulmonary arteries to the segmental level. No evidence of pulmonary embolism. Thoracic aorta is nonaneurysmal. Scattered atherosclerotic vascular calcifications of the aorta and coronary arteries. Normal heart size. Trace pericardial effusion. Mediastinum/Nodes: No enlarged mediastinal, hilar, or axillary lymph nodes. Thyroid gland, trachea, and esophagus demonstrate no significant findings. Lungs/Pleura: Moderate layering bilateral pleural effusions. Right greater than left bibasilar atelectasis. Widespread ground-glass opacities bilaterally with more confluent airspace consolidation  in the right upper lobe. No pneumothorax. Upper Abdomen: Renal sinus cyst versus hydronephrosis involving the left kidney, which is atrophic. Small hiatal hernia. Musculoskeletal: Scoliotic thoracolumbar curvature. No acute bony or chest wall findings. Review of the MIP images confirms the above findings. IMPRESSION: 1. Negative for pulmonary embolism. 2. Widespread ground-glass opacities bilaterally with more confluent airspace consolidation in the right upper lobe, suggestive of pulmonary edema. Superimposed pneumonia not excluded. 3. Moderate layering bilateral pleural effusions with right greater than left bibasilar atelectasis. 4. Trace pericardial effusion. 5. Renal sinus cyst versus hydronephrosis involving the left kidney, which is atrophic. Dedicated renal ultrasound recommended for further assessment. 6. Small hiatal hernia. 7. Aortic and coronary artery atherosclerosis (ICD10-I70.0). Electronically Signed   By: Davina Poke D.O.   On: 09/20/2022 11:09   ECHOCARDIOGRAM COMPLETE  Result Date: 09/19/2022    ECHOCARDIOGRAM REPORT   Patient Name:   Cindy Reed Date of Exam: 09/19/2022 Medical Rec #:  [id number]  Height:       61.0 in Accession #:    [id number] Weight:       111.1 lb Date of Birth:  Jul 20, 1931  BSA:          1.471 m Patient Age:    29 years   BP:           149/66 mmHg Patient Gender: F          HR:           88 bpm. Exam Location:  [facility] Procedure: 2D Echo, Color Doppler and Cardiac Doppler Indications:     Dyspnea R06.00  History:         Patient has  no prior history of Echocardiogram examinations.                  Risk Factors:Hypertension.  Sonographer:     Sherrie Sport Referring Phys:  [id number] Kanaya Gunnarson K Chenae Brager Diagnosing Phys: Ida Rogue MD  Sonographer Comments: Image quality was good. IMPRESSIONS  1. Left ventricular ejection fraction, by estimation, is 60 to 65%. The left ventricle has normal function. The left ventricle has no regional wall motion abnormalities. Left ventricular  diastolic parameters are consistent with Grade I diastolic dysfunction (impaired relaxation).  2. Right ventricular systolic function is normal. The right ventricular size is normal. There is moderately elevated pulmonary artery systolic pressure. The estimated right ventricular systolic pressure is 0000000 mmHg.  3. The mitral valve is normal in structure. Mild mitral valve regurgitation. No evidence of mitral stenosis. There is mild late systolic prolapse of both leaflets of the mitral valve.  4. Tricuspid valve regurgitation is moderate.  5. The aortic valve is normal in structure. Aortic valve regurgitation is mild. Aortic valve sclerosis is present, with no evidence of aortic valve stenosis.  6. The inferior vena cava is normal in size with greater than 50% respiratory variability, suggesting right atrial pressure of 3 mmHg. FINDINGS  Left Ventricle: Left ventricular ejection fraction, by estimation, is 60 to 65%. The left ventricle has normal function. The left ventricle has no regional wall motion abnormalities. The left ventricular internal cavity size was normal in size. There is  no left ventricular hypertrophy. Left ventricular diastolic parameters are consistent with Grade I diastolic dysfunction (impaired relaxation). Right Ventricle: The right ventricular size is normal. No increase in right ventricular wall thickness. Right ventricular systolic function is normal. There is moderately elevated pulmonary artery systolic pressure. The tricuspid regurgitant velocity is 3.61 m/s, and with an assumed right atrial pressure of 5 mmHg, the estimated right ventricular systolic pressure is 0000000 mmHg. Left Atrium: Left atrial size was normal in size. Right Atrium: Right atrial size was normal in size. Pericardium: There is no evidence of pericardial effusion. Mitral Valve: The mitral valve is normal in structure. There is mild late systolic prolapse of both leaflets of the mitral valve. There is moderate thickening  of the mitral valve leaflet(s). Mild mitral valve regurgitation. No evidence of mitral valve stenosis. Tricuspid Valve: The tricuspid valve is normal in structure. Tricuspid valve regurgitation is moderate . No evidence of tricuspid stenosis. Aortic Valve: The aortic valve is normal in structure. Aortic valve regurgitation is mild. Aortic valve sclerosis is present, with no evidence of aortic valve stenosis. Aortic valve mean gradient measures 2.0 mmHg. Aortic valve peak gradient measures 4.0  mmHg. Aortic valve area, by VTI measures 4.00 cm. Pulmonic Valve: The pulmonic valve was normal in structure. Pulmonic valve regurgitation is not visualized. No evidence of pulmonic stenosis. Aorta: The aortic root is normal in size and structure. Venous: The inferior vena cava is normal in size with greater than 50% respiratory variability, suggesting right atrial pressure of 3 mmHg. IAS/Shunts: No atrial level shunt detected by color flow Doppler.  LEFT VENTRICLE PLAX 2D LVIDd:         3.70 cm   Diastology LVIDs:         2.30 cm   LV e' medial:    5.87 cm/s LV PW:         1.00 cm   LV E/e' medial:  15.2 LV IVS:        0.80 cm   LV e' lateral:  8.05 cm/s LVOT diam:     2.00 cm   LV E/e' lateral: 11.1 LV SV:         63 LV SV Index:   43 LVOT Area:     3.14 cm  RIGHT VENTRICLE RV Basal diam:  2.70 cm RV Mid diam:    2.30 cm LEFT ATRIUM             Index        RIGHT ATRIUM           Index LA diam:        2.70 cm 1.84 cm/m   RA Area:     14.90 cm LA Vol (A2C):   25.7 ml 17.47 ml/m  RA Volume:   36.30 ml  24.67 ml/m LA Vol (A4C):   29.7 ml 20.19 ml/m LA Biplane Vol: 27.8 ml 18.90 ml/m  AORTIC VALVE AV Area (Vmax):    3.42 cm AV Area (Vmean):   2.98 cm AV Area (VTI):     4.00 cm AV Vmax:           100.00 cm/s AV Vmean:          67.100 cm/s AV VTI:            0.158 m AV Peak Grad:      4.0 mmHg AV Mean Grad:      2.0 mmHg LVOT Vmax:         109.00 cm/s LVOT Vmean:        63.600 cm/s LVOT VTI:          0.201 m LVOT/AV  VTI ratio: 1.27  AORTA Ao Root diam: 3.42 cm MITRAL VALVE                TRICUSPID VALVE MV Area (PHT): 3.36 cm     TR Peak grad:   52.1 mmHg MV Decel Time: 226 msec     TR Vmax:        361.00 cm/s MV E velocity: 89.10 cm/s MV A velocity: 113.00 cm/s  SHUNTS MV E/A ratio:  0.79         Systemic VTI:  0.20 m                             Systemic Diam: 2.00 cm Ida Rogue MD Electronically signed by Ida Rogue MD Signature Date/Time: 09/19/2022/4:45:18 PM    Final        Estill Cotta M.D. [facility] 09/21/2022, 12:58 PM  Available via Epic secure chat 7am-7pm After 7 pm, please refer to night coverage provider listed on amion.

## 2022-09-21 NOTE — Progress Notes (Signed)
SLP Cancellation Note  Patient Details Name: Cindy Reed MRN: [id number] DOB: May 21, 1931   Cancelled treatment:       Reason Eval/Treat Not Completed: SLP screened, no needs identified, will sign off  Daughter present during screening with no report of acute needs.   Cassandre Oleksy B. Rutherford Nail, M.S., CCC-SLP, [place] Pathologist Certified Brain Injury [place]  [facility] [id number] Ascom [phone] Fax [phone]  Stormy Fabian 09/21/2022, 1:26 PM

## 2022-09-21 NOTE — Progress Notes (Signed)
Around 1700 notified by RT of patient's O2 in the 70's on HFNC 13L. Per RT she increased HFNC to 15L.  This Probation officer came to the room. Patient is resting, easily aroused. RR 31, O2 sats 78% on HFNC 15L. Rest of VSS. RT placed the patient on heated HFNC. Notified Dr Tana Coast of the above. Per MD pulmonary consultant will see patient. MD also placed an order to transfer patient to step-down. Patient's daughters in the room and aware of the above.

## 2022-09-21 NOTE — Progress Notes (Signed)
Patient with RR 30, O2 sats 93% on heated HFNC. She is resting in bed. A&Ox4. No signs of pain. Daughters at the bedside. Patient has not transferred yet. Awaiting a room.

## 2022-09-21 NOTE — Progress Notes (Addendum)
Pt transferred to ICU, report already given to primary RN, belonging packed by relatives. Pt transported on 15L/min NRB. RT made aware, No distress noted.

## 2022-09-21 NOTE — Progress Notes (Signed)
Patient's oxygen increased from 9L HFNC to 13L HFNC. RT notified RN and doctor of patient's increased oxygen requirement.

## 2022-09-22 DIAGNOSIS — J189 Pneumonia, unspecified organism: Secondary | ICD-10-CM | POA: Diagnosis not present

## 2022-09-22 LAB — CBC
HCT: 33.1 % — ABNORMAL LOW (ref 36.0–46.0)
Hemoglobin: 11.2 g/dL — ABNORMAL LOW (ref 12.0–15.0)
MCH: 29 pg (ref 26.0–34.0)
MCHC: 33.8 g/dL (ref 30.0–36.0)
MCV: 85.8 fL (ref 80.0–100.0)
Platelets: 293 10*3/uL (ref 150–400)
RBC: 3.86 MIL/uL — ABNORMAL LOW (ref 3.87–5.11)
RDW: 13.3 % (ref 11.5–15.5)
WBC: 11.9 10*3/uL — ABNORMAL HIGH (ref 4.0–10.5)
nRBC: 0 % (ref 0.0–0.2)

## 2022-09-22 LAB — BASIC METABOLIC PANEL
Anion gap: 12 (ref 5–15)
BUN: 23 mg/dL (ref 8–23)
CO2: 27 mmol/L (ref 22–32)
Calcium: 7.1 mg/dL — ABNORMAL LOW (ref 8.9–10.3)
Chloride: 95 mmol/L — ABNORMAL LOW (ref 98–111)
Creatinine, Ser: 1.03 mg/dL — ABNORMAL HIGH (ref 0.44–1.00)
GFR, Estimated: 52 mL/min — ABNORMAL LOW (ref >60)
Glucose, Bld: 138 mg/dL — ABNORMAL HIGH (ref 70–99)
Potassium: 2.8 mmol/L — ABNORMAL LOW (ref 3.5–5.1)
Sodium: 134 mmol/L — ABNORMAL LOW (ref 135–145)

## 2022-09-22 LAB — MAGNESIUM: Magnesium: 2.4 mg/dL (ref 1.7–2.4)

## 2022-09-22 MED ORDER — GLYCOPYRROLATE 1 MG PO TABS: 1.0000 mg | ORAL_TABLET | ORAL | Status: DC | PRN

## 2022-09-22 MED ORDER — POLYVINYL ALCOHOL 1.4 % OP SOLN: 1.0000 [drp] | Freq: Four times a day (QID) | OPHTHALMIC | Status: DC | PRN

## 2022-09-22 MED ORDER — MORPHINE SULFATE (PF) 2 MG/ML IV SOLN
1.0000 mg | Freq: Once | INTRAVENOUS | Status: AC
  Administered 2022-09-22: 1 mg via INTRAVENOUS
  Filled 2022-09-22: qty 1

## 2022-09-22 MED ORDER — ACETAMINOPHEN 650 MG RE SUPP: 650.0000 mg | Freq: Four times a day (QID) | RECTAL | Status: DC | PRN

## 2022-09-22 MED ORDER — LORAZEPAM 2 MG/ML IJ SOLN: 2.0000 mg | INTRAMUSCULAR | Status: DC | PRN

## 2022-09-22 MED ORDER — MORPHINE 100MG IN NS 100ML (1MG/ML) PREMIX INFUSION
1.0000 mg/h | INTRAVENOUS | Status: DC
  Administered 2022-09-22: 1 mg/h via INTRAVENOUS
  Filled 2022-09-22: qty 100

## 2022-09-22 MED ORDER — GLYCOPYRROLATE 0.2 MG/ML IJ SOLN: 0.2000 mg | INTRAMUSCULAR | Status: DC | PRN

## 2022-09-22 MED ORDER — POTASSIUM CHLORIDE 10 MEQ/100ML IV SOLN
10.0000 meq | INTRAVENOUS | Status: DC
  Administered 2022-09-22: 10 meq via INTRAVENOUS
  Filled 2022-09-22 (×8): qty 100

## 2022-09-22 MED ORDER — MORPHINE SULFATE (PF) 2 MG/ML IV SOLN
1.0000 mg | INTRAVENOUS | Status: DC | PRN
  Administered 2022-09-22 – 2022-09-23 (×3): 1 mg via INTRAVENOUS
  Filled 2022-09-22: qty 1

## 2022-09-22 MED ORDER — ACETAMINOPHEN 325 MG PO TABS: 650.0000 mg | ORAL_TABLET | Freq: Four times a day (QID) | ORAL | Status: DC | PRN

## 2022-09-22 MED ORDER — ALUM & MAG HYDROXIDE-SIMETH 200-200-20 MG/5ML PO SUSP
15.0000 mL | ORAL | Status: DC | PRN
  Administered 2022-09-22: 15 mL via ORAL
  Filled 2022-09-22: qty 30

## 2022-09-22 NOTE — Progress Notes (Signed)
CMO orders and order set initiated.

## 2022-09-22 NOTE — Progress Notes (Signed)
Called to bedside for patient's tachypnea, persistent hypoxia, and all in the setting of requiring max NRB and optiflow. We have reached a juncture whether to escalate care vs. Comfort care. I discussed with both daughters and patient that her escalating oxygen requirements is troubling. We wanted to try increased steroids in case she was dealing with an organizing pneumonia or some type of ILD. The fact that she has not responded to steroids is troubling. She has done worse. I wonder if some viral bug kicked off some inflammatory process or underlying ILD. Regardless, patient expressed her desire to "go on and go home." She has declined further interventions as she feels like she has a good life. And she does not want to pursue aggressive things. I discussed MV may allow me to do bronchoscopy which could allow help provide some diagnostic yield. But that is exceedingly low likelihood. And I am unlikely to liberate her from MV. Even with further discussion of pros and cons of MV, patient re-iterated to me that she would like to be comfortable. I certainly respect that given she has full capability and faculties. Daughters were present as witnesses and agree that she would want to be comfortable in such a situation.   Code status changed to DNR.   1x dose of morphine given.   Patient waiting pastor's arrival.  Arcelia Jew MD PCCM

## 2022-09-22 NOTE — Progress Notes (Signed)
Patient has had an eventful day, earlier this shift Dr. Gwendalyn Ege discussed patient's respiratory status with the family and options for moving forward.  Patient declined to have further interventions.  Per patient and family, would like for pastor to come and visit before initiating comfort care medications, but desires for all other medications to be withheld, family and patient request to spend time with the patient.  After the pastor visited Dr. Gwendalyn Ege spoke with the family and comfort care medications were ordered.  Dr. Gwendalyn Ege desires for patient to remain on heated high flow and non-re-breather as this helped support the patient's comfort.  Family continues at bedside, support given to family, and patient resting comfortably at this time.

## 2022-09-22 NOTE — Plan of Care (Signed)
Continuing with plan of care. 

## 2022-09-23 DIAGNOSIS — J9601 Acute respiratory failure with hypoxia: Secondary | ICD-10-CM | POA: Diagnosis not present

## 2022-09-23 DIAGNOSIS — R531 Weakness: Secondary | ICD-10-CM | POA: Diagnosis not present

## 2022-09-23 DIAGNOSIS — N1831 Chronic kidney disease, stage 3a: Secondary | ICD-10-CM

## 2022-09-23 DIAGNOSIS — R296 Repeated falls: Secondary | ICD-10-CM | POA: Diagnosis not present

## 2022-09-23 DIAGNOSIS — Z515 Encounter for palliative care: Secondary | ICD-10-CM

## 2022-09-23 DIAGNOSIS — I480 Paroxysmal atrial fibrillation: Secondary | ICD-10-CM

## 2022-09-23 DIAGNOSIS — Z66 Do not resuscitate: Secondary | ICD-10-CM

## 2022-09-23 DIAGNOSIS — J189 Pneumonia, unspecified organism: Secondary | ICD-10-CM | POA: Diagnosis not present

## 2022-09-23 LAB — LEGIONELLA PNEUMOPHILA SEROGP 1 UR AG: L. pneumophila Serogp 1 Ur Ag: NEGATIVE

## 2022-09-23 LAB — GLUCOSE, CAPILLARY: Glucose-Capillary: 123 mg/dL — ABNORMAL HIGH (ref 70–99)

## 2022-10-01 LAB — ZINC: Zinc: 34 ug/dL — ABNORMAL LOW (ref 44–115)
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
## 2022-10-02 NOTE — Death Summary Note (Signed)
DEATH SUMMARY   Patient Details  Name: Cindy Reed MRN: [id number] DOB: 01-15-1931  Admission/Discharge Information   Admit Date:  26-Sep-2022  Date of Death: Date of Death: 2022-10-01  Time of Death: Time of Death: March 18, 1613  Length of Stay: 5  Referring Physician: [name], MD   Reason(s) for Hospitalization  Generalized Weakness   Diagnoses  Preliminary cause of death: Acute respiratory failure with hypoxia ([place]) Secondary Diagnoses (including complications and co-morbidities):  Principal Problem:   Multifocal pneumonia Active Problems:   Hyponatremia   Hypertension   Paroxysmal atrial fibrillation (HCC)   Stage 3a chronic kidney disease ([place])   B12 deficiency   Frequent falls   Hypothyroidism   Generalized weakness   Acute respiratory failure with hypoxia [facility])   Brief Hospital Course (including significant findings, care, treatment, and services provided and events leading to death)  Cindy Reed is a 88 y.o. year old female with a PMH of depression, atrial fibrillation, hypothyroidism, and recent traumatic SAH who presented to [facility] ER on 2022/09/26 with generalized weakness, persistent cough, and poor po intake.  She also endorsed having a mechanical fall.  She was subsequently diagnosed with multifocal pneumonia and admitted to the telemetry unit for additional workup and treatment per hospitalist team.  Hospital Course On 09/21/22 pt transferred to the stepdown unit with worsening acute hypoxic respiratory failure requiring HHFNC.  PCCM team consulted.  Despite aggressive treatment pt developed worsening hypoxia despite NRB and HHFNC.  PCCM team discussed goals of care with pt and pts family and per request pt transitioned to Comfort Measures Only on 09/22/22.  Pt expired on 10-01-22 at 1613-03-18 with family at bedside.   Pertinent Labs and Studies  Significant Diagnostic Studies US Venous Img Lower Bilateral (DVT)  Result Date: 09/21/2022 CLINICAL DATA:  Edema EXAM:  BILATERAL LOWER EXTREMITY VENOUS DOPPLER ULTRASOUND TECHNIQUE: Gray-scale sonography with graded compression, as well as color Doppler and duplex ultrasound were performed to evaluate the lower extremity deep venous systems from the level of the common femoral vein and including the common femoral, femoral, profunda femoral, popliteal and calf veins including the posterior tibial, peroneal and gastrocnemius veins when visible. The superficial great saphenous vein was also interrogated. Spectral Doppler was utilized to evaluate flow at rest and with distal augmentation maneuvers in the common femoral, femoral and popliteal veins. COMPARISON:  None Available. FINDINGS: RIGHT LOWER EXTREMITY Common Femoral Vein: No evidence of thrombus. Normal compressibility, respiratory phasicity and response to augmentation. Saphenofemoral Junction: No evidence of thrombus. Normal compressibility and flow on color Doppler imaging. Profunda Femoral Vein: No evidence of thrombus. Normal compressibility and flow on color Doppler imaging. Femoral Vein: No evidence of thrombus. Normal compressibility, respiratory phasicity and response to augmentation. Popliteal Vein: No evidence of thrombus. Normal compressibility, respiratory phasicity and response to augmentation. Calf Veins: No evidence of thrombus. Normal compressibility and flow on color Doppler imaging. LEFT LOWER EXTREMITY Common Femoral Vein: No evidence of thrombus. Normal compressibility, respiratory phasicity and response to augmentation. Saphenofemoral Junction: No evidence of thrombus. Normal compressibility and flow on color Doppler imaging. Profunda Femoral Vein: No evidence of thrombus. Normal compressibility and flow on color Doppler imaging. Femoral Vein: No evidence of thrombus. Normal compressibility, respiratory phasicity and response to augmentation. Popliteal Vein: No evidence of thrombus. Normal compressibility, respiratory phasicity and response to augmentation.  Calf Veins: No evidence of thrombus. Normal compressibility and flow on color Doppler imaging. Other Findings:  None. IMPRESSION: No evidence of deep venous thrombosis in either  lower extremity. Electronically Signed   By: Albin Felling M.D.   On: 09/21/2022 12:42   CT Angio Chest Pulmonary Embolism (PE) W or WO Contrast  Result Date: 09/20/2022 CLINICAL DATA:  Pulmonary embolism (PE) suspected, high prob EXAM: CT ANGIOGRAPHY CHEST WITH CONTRAST TECHNIQUE: Multidetector CT imaging of the chest was performed using the standard protocol during bolus administration of intravenous contrast. Multiplanar CT image reconstructions and MIPs were obtained to evaluate the vascular anatomy. RADIATION DOSE REDUCTION: This exam was performed according to the departmental dose-optimization program which includes automated exposure control, adjustment of the mA and/or kV according to patient size and/or use of iterative reconstruction technique. CONTRAST:  66mL OMNIPAQUE IOHEXOL 350 MG/ML SOLN COMPARISON:  X-ray 09/19/2022 FINDINGS: Cardiovascular: Satisfactory opacification of the pulmonary arteries to the segmental level. No evidence of pulmonary embolism. Thoracic aorta is nonaneurysmal. Scattered atherosclerotic vascular calcifications of the aorta and coronary arteries. Normal heart size. Trace pericardial effusion. Mediastinum/Nodes: No enlarged mediastinal, hilar, or axillary lymph nodes. Thyroid gland, trachea, and esophagus demonstrate no significant findings. Lungs/Pleura: Moderate layering bilateral pleural effusions. Right greater than left bibasilar atelectasis. Widespread ground-glass opacities bilaterally with more confluent airspace consolidation in the right upper lobe. No pneumothorax. Upper Abdomen: Renal sinus cyst versus hydronephrosis involving the left kidney, which is atrophic. Small hiatal hernia. Musculoskeletal: Scoliotic thoracolumbar curvature. No acute bony or chest wall findings. Review of the  MIP images confirms the above findings. IMPRESSION: 1. Negative for pulmonary embolism. 2. Widespread ground-glass opacities bilaterally with more confluent airspace consolidation in the right upper lobe, suggestive of pulmonary edema. Superimposed pneumonia not excluded. 3. Moderate layering bilateral pleural effusions with right greater than left bibasilar atelectasis. 4. Trace pericardial effusion. 5. Renal sinus cyst versus hydronephrosis involving the left kidney, which is atrophic. Dedicated renal ultrasound recommended for further assessment. 6. Small hiatal hernia. 7. Aortic and coronary artery atherosclerosis (ICD10-I70.0). Electronically Signed   By: Davina Poke D.O.   On: 09/20/2022 11:09   ECHOCARDIOGRAM COMPLETE  Result Date: 09/19/2022    ECHOCARDIOGRAM REPORT   Patient Name:   Avree E Sprung Date of Exam: 09/19/2022 Medical Rec #:  [id number]  Height:       61.0 in Accession #:    [id number] Weight:       111.1 lb Date of Birth:  10-29-1931  BSA:          1.471 m Patient Age:    3 years   BP:           149/66 mmHg Patient Gender: F          HR:           88 bpm. Exam Location:  [facility] Procedure: 2D Echo, Color Doppler and Cardiac Doppler Indications:     Dyspnea R06.00  History:         Patient has no prior history of Echocardiogram examinations.                  Risk Factors:Hypertension.  Sonographer:     Sherrie Sport Referring Phys:  [id number] RIPUDEEP K RAI Diagnosing Phys: Ida Rogue MD  Sonographer Comments: Image quality was good. IMPRESSIONS  1. Left ventricular ejection fraction, by estimation, is 60 to 65%. The left ventricle has normal function. The left ventricle has no regional wall motion abnormalities. Left ventricular diastolic parameters are consistent with Grade I diastolic dysfunction (impaired relaxation).  2. Right ventricular systolic function is normal. The right ventricular size is normal. There is moderately elevated  pulmonary artery systolic pressure. The estimated right  ventricular systolic pressure is 0000000 mmHg.  3. The mitral valve is normal in structure. Mild mitral valve regurgitation. No evidence of mitral stenosis. There is mild late systolic prolapse of both leaflets of the mitral valve.  4. Tricuspid valve regurgitation is moderate.  5. The aortic valve is normal in structure. Aortic valve regurgitation is mild. Aortic valve sclerosis is present, with no evidence of aortic valve stenosis.  6. The inferior vena cava is normal in size with greater than 50% respiratory variability, suggesting right atrial pressure of 3 mmHg. FINDINGS  Left Ventricle: Left ventricular ejection fraction, by estimation, is 60 to 65%. The left ventricle has normal function. The left ventricle has no regional wall motion abnormalities. The left ventricular internal cavity size was normal in size. There is  no left ventricular hypertrophy. Left ventricular diastolic parameters are consistent with Grade I diastolic dysfunction (impaired relaxation). Right Ventricle: The right ventricular size is normal. No increase in right ventricular wall thickness. Right ventricular systolic function is normal. There is moderately elevated pulmonary artery systolic pressure. The tricuspid regurgitant velocity is 3.61 m/s, and with an assumed right atrial pressure of 5 mmHg, the estimated right ventricular systolic pressure is 0000000 mmHg. Left Atrium: Left atrial size was normal in size. Right Atrium: Right atrial size was normal in size. Pericardium: There is no evidence of pericardial effusion. Mitral Valve: The mitral valve is normal in structure. There is mild late systolic prolapse of both leaflets of the mitral valve. There is moderate thickening of the mitral valve leaflet(s). Mild mitral valve regurgitation. No evidence of mitral valve stenosis. Tricuspid Valve: The tricuspid valve is normal in structure. Tricuspid valve regurgitation is moderate . No evidence of tricuspid stenosis. Aortic Valve: The aortic  valve is normal in structure. Aortic valve regurgitation is mild. Aortic valve sclerosis is present, with no evidence of aortic valve stenosis. Aortic valve mean gradient measures 2.0 mmHg. Aortic valve peak gradient measures 4.0  mmHg. Aortic valve area, by VTI measures 4.00 cm. Pulmonic Valve: The pulmonic valve was normal in structure. Pulmonic valve regurgitation is not visualized. No evidence of pulmonic stenosis. Aorta: The aortic root is normal in size and structure. Venous: The inferior vena cava is normal in size with greater than 50% respiratory variability, suggesting right atrial pressure of 3 mmHg. IAS/Shunts: No atrial level shunt detected by color flow Doppler.  LEFT VENTRICLE PLAX 2D LVIDd:         3.70 cm   Diastology LVIDs:         2.30 cm   LV e' medial:    5.87 cm/s LV PW:         1.00 cm   LV E/e' medial:  15.2 LV IVS:        0.80 cm   LV e' lateral:   8.05 cm/s LVOT diam:     2.00 cm   LV E/e' lateral: 11.1 LV SV:         63 LV SV Index:   43 LVOT Area:     3.14 cm  RIGHT VENTRICLE RV Basal diam:  2.70 cm RV Mid diam:    2.30 cm LEFT ATRIUM             Index        RIGHT ATRIUM           Index LA diam:        2.70 cm 1.84 cm/m   RA  Area:     14.90 cm LA Vol (A2C):   25.7 ml 17.47 ml/m  RA Volume:   36.30 ml  24.67 ml/m LA Vol (A4C):   29.7 ml 20.19 ml/m LA Biplane Vol: 27.8 ml 18.90 ml/m  AORTIC VALVE AV Area (Vmax):    3.42 cm AV Area (Vmean):   2.98 cm AV Area (VTI):     4.00 cm AV Vmax:           100.00 cm/s AV Vmean:          67.100 cm/s AV VTI:            0.158 m AV Peak Grad:      4.0 mmHg AV Mean Grad:      2.0 mmHg LVOT Vmax:         109.00 cm/s LVOT Vmean:        63.600 cm/s LVOT VTI:          0.201 m LVOT/AV VTI ratio: 1.27  AORTA Ao Root diam: 3.42 cm MITRAL VALVE                TRICUSPID VALVE MV Area (PHT): 3.36 cm     TR Peak grad:   52.1 mmHg MV Decel Time: 226 msec     TR Vmax:        361.00 cm/s MV E velocity: 89.10 cm/s MV A velocity: 113.00 cm/s  SHUNTS MV E/A  ratio:  0.79         Systemic VTI:  0.20 m                             Systemic Diam: 2.00 cm Ida Rogue MD Electronically signed by Ida Rogue MD Signature Date/Time: 09/19/2022/4:45:18 PM    Final    DG Chest 2 View  Result Date: 09/19/2022 CLINICAL DATA:  Multifocal pneumonia. EXAM: CHEST - 2 VIEW COMPARISON:  One-view chest x-ray 09/29/2022 FINDINGS: Heart size is normal. Right upper lobe and bilateral lower lobes, right greater than left airspace opacities have increased. Bilateral pleural effusions have increased, right greater than left. The visualized soft tissues and bony thorax are unremarkable. IMPRESSION: Increasing right upper lobe and bilateral lower lobes, right greater than left airspace opacities and bilateral pleural effusions, right greater than left. Findings are compatible with multifocal pneumonia. Increased opacities may be due to disease progression or rehydration. Electronically Signed   By: San Morelle M.D.   On: 09/19/2022 08:53   CT Head Wo Contrast  Result Date: 09/21/2022 CLINICAL DATA:  Mental status change, unknown cause EXAM: CT HEAD WITHOUT CONTRAST TECHNIQUE: Contiguous axial images were obtained from the base of the skull through the vertex without intravenous contrast. RADIATION DOSE REDUCTION: This exam was performed according to the departmental dose-optimization program which includes automated exposure control, adjustment of the mA and/or kV according to patient size and/or use of iterative reconstruction technique. COMPARISON:  08/17/2022 FINDINGS: Brain: No evidence of acute infarction, hemorrhage, hydrocephalus, extra-axial collection or mass lesion/mass effect. Focal encephalomalacia in the anterior right frontal lobe at site of previously seen hemorrhagic contusion. No residual blood products are evident by CT. Patchy low-density changes within the periventricular and subcortical white matter compatible with chronic microvascular ischemic  change. Mild diffuse cerebral volume loss. Vascular: No hyperdense vessel or unexpected calcification. Skull: Normal. Negative for fracture or focal lesion. Sinuses/Orbits: No acute finding. Other: None. IMPRESSION: 1. No acute intracranial abnormality. 2. Focal encephalomalacia in  the anterior right frontal lobe at site of previously seen hemorrhagic contusion. No residual blood products are evident by CT. Electronically Signed   By: Davina Poke D.O.   On: 09/08/2022 13:32   DG Chest Portable 1 View  Result Date: 09/04/2022 CLINICAL DATA:  Cough and weakness. EXAM: PORTABLE CHEST 1 VIEW COMPARISON:  None Available. FINDINGS: The cardiac silhouette, mediastinal and hilar contours are within normal limits. Bilateral pulmonary infiltrates consistent with polylobar pneumonia. There are associated bilateral parapneumonic effusions. No pulmonary lesions or pneumothorax. The bony thorax is intact. IMPRESSION: Polylobar pneumonia and parapneumonic effusions. Electronically Signed   By: Marijo Sanes M.D.   On: 09/14/2022 13:12    Microbiology Recent Results (from the past 240 hour(s))  Resp Panel by RT-PCR (Flu A&B, Covid) Anterior Nasal Swab     Status: None   Collection Time: 09/14/2022  1:04 PM   Specimen: Anterior Nasal Swab  Result Value Ref Range Status   SARS Coronavirus 2 by RT PCR NEGATIVE NEGATIVE Final    Comment: (NOTE) SARS-CoV-2 target nucleic acids are NOT DETECTED.  The SARS-CoV-2 RNA is generally detectable in upper respiratory specimens during the acute phase of infection. The lowest concentration of SARS-CoV-2 viral copies this assay can detect is 138 copies/mL. A negative result does not preclude SARS-Cov-2 infection and should not be used as the sole basis for treatment or other patient management decisions. A negative result may occur with  improper specimen collection/handling, submission of specimen other than nasopharyngeal swab, presence of viral mutation(s) within  the areas targeted by this assay, and inadequate number of viral copies(<138 copies/mL). A negative result must be combined with clinical observations, patient history, and epidemiological information. The expected result is Negative.  Fact Sheet for Patients:  EntrepreneurPulse.com.au  Fact Sheet for Healthcare Providers:  IncredibleEmployment.be  This test is no t yet approved or cleared by the [place] FDA and  has been authorized for detection and/or diagnosis of SARS-CoV-2 by FDA under an Emergency Use Authorization (EUA). This EUA will remain  in effect (meaning this test can be used) for the duration of the COVID-19 declaration under Section 564(b)(1) of the Act, 21 U.S.C.section 360bbb-3(b)(1), unless the authorization is terminated  or revoked sooner.       Influenza A by PCR NEGATIVE NEGATIVE Final   Influenza B by PCR NEGATIVE NEGATIVE Final    Comment: (NOTE) The Xpert Xpress SARS-CoV-2/FLU/RSV plus assay is intended as an aid in the diagnosis of influenza from Nasopharyngeal swab specimens and should not be used as a sole basis for treatment. Nasal washings and aspirates are unacceptable for Xpert Xpress SARS-CoV-2/FLU/RSV testing.  Fact Sheet for Patients: EntrepreneurPulse.com.au  Fact Sheet for Healthcare Providers: IncredibleEmployment.be  This test is not yet approved or cleared by the [place] FDA and has been authorized for detection and/or diagnosis of SARS-CoV-2 by FDA under an Emergency Use Authorization (EUA). This EUA will remain in effect (meaning this test can be used) for the duration of the COVID-19 declaration under Section 564(b)(1) of the Act, 21 U.S.C. section 360bbb-3(b)(1), unless the authorization is terminated or revoked.  Performed at [facility], [address] [place], [place] [address]   C Difficile Quick Screen w PCR reflex     Status:  None   Collection Time: 09/20/22 12:30 AM   Specimen: STOOL  Result Value Ref Range Status   C Diff antigen NEGATIVE NEGATIVE Final   C Diff toxin NEGATIVE NEGATIVE Final   C Diff interpretation  No C. difficile detected.  Final    Comment: Performed at [facility], [address] [place], [place] [address]  MRSA Next Gen by PCR, Nasal     Status: None   Collection Time: 09/20/22 11:44 AM   Specimen: Nasal Mucosa; Nasal Swab  Result Value Ref Range Status   MRSA by PCR Next Gen NOT DETECTED NOT DETECTED Final    Comment: (NOTE) The GeneXpert MRSA Assay (FDA approved for NASAL specimens only), is one component of a comprehensive MRSA colonization surveillance program. It is not intended to diagnose MRSA infection nor to guide or monitor treatment for MRSA infections. Test performance is not FDA approved in patients less than 59 years old. Performed at [facility], [address] [place], [place] [address]   SARS Coronavirus 2 by RT PCR (hospital order, performed in [facility] hospital lab) *cepheid single result test* Anterior Nasal Swab     Status: None   Collection Time: 09/21/22 11:44 AM   Specimen: Anterior Nasal Swab  Result Value Ref Range Status   SARS Coronavirus 2 by RT PCR NEGATIVE NEGATIVE Final    Comment: (NOTE) SARS-CoV-2 target nucleic acids are NOT DETECTED.  The SARS-CoV-2 RNA is generally detectable in upper and lower respiratory specimens during the acute phase of infection. The lowest concentration of SARS-CoV-2 viral copies this assay can detect is 250 copies / mL. A negative result does not preclude SARS-CoV-2 infection and should not be used as the sole basis for treatment or other patient management decisions.  A negative result may occur with improper specimen collection / handling, submission of specimen other than nasopharyngeal swab, presence of viral mutation(s) within the areas targeted by this assay, and inadequate number of  viral copies (<250 copies / mL). A negative result must be combined with clinical observations, patient history, and epidemiological information.  Fact Sheet for Patients:   https://www.patel.info/  Fact Sheet for Healthcare Providers: https://hall.com/  This test is not yet approved or  cleared by the [place] FDA and has been authorized for detection and/or diagnosis of SARS-CoV-2 by FDA under an Emergency Use Authorization (EUA).  This EUA will remain in effect (meaning this test can be used) for the duration of the COVID-19 declaration under Section 564(b)(1) of the Act, 21 U.S.C. section 360bbb-3(b)(1), unless the authorization is terminated or revoked sooner.  Performed at [facility], [address] [place], [place] [address]   Respiratory (~20 pathogens) panel by PCR     Status: None   Collection Time: 09/21/22 11:44 AM   Specimen: Nasopharyngeal Swab; Respiratory  Result Value Ref Range Status   Adenovirus NOT DETECTED NOT DETECTED Final   Coronavirus 229E NOT DETECTED NOT DETECTED Final    Comment: (NOTE) The Coronavirus on the Respiratory Panel, DOES NOT test for the novel  Coronavirus (2019 nCoV)    Coronavirus HKU1 NOT DETECTED NOT DETECTED Final   Coronavirus NL63 NOT DETECTED NOT DETECTED Final   Coronavirus OC43 NOT DETECTED NOT DETECTED Final   Metapneumovirus NOT DETECTED NOT DETECTED Final   Rhinovirus / Enterovirus NOT DETECTED NOT DETECTED Final   Influenza A NOT DETECTED NOT DETECTED Final   Influenza B NOT DETECTED NOT DETECTED Final   Parainfluenza Virus 1 NOT DETECTED NOT DETECTED Final   Parainfluenza Virus 2 NOT DETECTED NOT DETECTED Final   Parainfluenza Virus 3 NOT DETECTED NOT DETECTED Final   Parainfluenza Virus 4 NOT DETECTED NOT DETECTED Final   Respiratory Syncytial Virus NOT DETECTED  NOT DETECTED Final   Bordetella pertussis NOT DETECTED NOT DETECTED Final   Bordetella  Parapertussis NOT DETECTED NOT DETECTED Final   Chlamydophila pneumoniae NOT DETECTED NOT DETECTED Final   Mycoplasma pneumoniae NOT DETECTED NOT DETECTED Final    Comment: Performed at [facility], [place] [address], [place], [place] [address]    Lab Basic Metabolic Panel: Recent Labs  Lab 09/07/2022 1148 09/22/2022 1158 09/17/2022 1158 09/19/22 0459 09/19/22 1607 09/20/22 0556 09/21/22 0510 09/21/22 0512 09/22/22 0336  NA  --  126*   < > 125* 128* 128*  --  134* 134*  K  --  3.4*  --  3.1*  --  3.1*  --  2.5* 2.8*  CL  --  94*  --  99  --  94*  --  98 95*  CO2  --  25  --  21*  --  25  --  25 27  GLUCOSE  --  104*  --  90  --  119*  --  125* 138*  BUN  --  14  --  12  --  15  --  19 23  CREATININE  --  1.00  --  0.79  --  0.81  --  1.07* 1.03*  CALCIUM  --  7.5*  --  6.9*  --  7.0*  --  7.3* 7.1*  MG  --  1.7  --   --   --   --  1.6*  --  2.4  PHOS 1.7*  --   --   --   --   --   --   --   --    < > = values in this interval not displayed.   Liver Function Tests: Recent Labs  Lab 09/30/2022 1158 09/21/22 0512  AST 80* 62*  ALT 66* 63*  ALKPHOS 69 91  BILITOT 1.2 0.9  PROT 5.5* 5.7*  ALBUMIN 3.0* 2.8*   No results for input(s): "LIPASE", "AMYLASE" in the last 168 hours. No results for input(s): "AMMONIA" in the last 168 hours. CBC: Recent Labs  Lab 09/30/2022 1158 09/19/22 0459 09/21/22 0512 09/22/22 0336  WBC 9.3 10.5 15.0* 11.9*  NEUTROABS 7.9*  --   --   --   HGB 8.9* 9.0* 10.7* 11.2*  HCT 27.2* 27.9* 32.3* 33.1*  MCV 90.4 91.5 87.1 85.8  PLT 185 188 291 293   Cardiac Enzymes: Recent Labs  Lab 09/21/2022 1158  CKTOTAL 153   Sepsis Labs: Recent Labs  Lab 09/27/2022 1148 09/11/2022 1158 09/19/22 0459 09/20/22 0556 09/21/22 0512 09/21/22 0528 09/22/22 0336  PROCALCITON 0.17  --  0.28 0.36  --  0.39  --   WBC  --  9.3 10.5  --  15.0*  --  11.9*    Procedures/Operations  None    Donell Beers, AGNP  Pulmonary/Critical Care Pager [phone]  (please enter 7 digits) PCCM Consult Pager [phone] (please enter 7 digits)

## 2022-10-02 NOTE — Progress Notes (Signed)
Nutrition Brief Note  Chart reviewed. Pt now transitioning to comfort care.  No further nutrition interventions planned at this time.  Please re-consult as needed.   Briyonna Omara W, RD, LDN, CDCES Registered Dietitian II Certified Diabetes Care and Education Specialist Please refer to AMION for RD and/or RD on-call/weekend/after hours pager   

## 2022-10-02 NOTE — Plan of Care (Signed)
Discussed with patient in front of family plan of care for the evening, pain management and comfort measures / medication with some teach back displayed.  Problem: Education: Goal: Knowledge of General Education information will improve Description: Including pain rating scale, medication(s)/side effects and non-pharmacologic comfort measures Outcome: Progressing   Problem: Health Behavior/Discharge Planning: Goal: Ability to manage health-related needs will improve Outcome: Progressing   Problem: Pain Managment: Goal: General experience of comfort will improve Outcome: Progressing

## 2022-10-02 NOTE — Progress Notes (Signed)
[facility] Consultation      Date: 09/28/22,   MRN# [id number] Cindy Reed 1931-03-29  Cindy Reed is a 88 y.o. old female seen in consultation for dyspnea at the request of Dr. Tana Coast.  CHIEF COMPLAINT:  Dyspnea  HISTORY OF [facility] course per hospitalist: Patient is a 88 year old female with depression, anxiety, A-fib on aspirin, amiodarone, hypothyroidism, recent history of traumatic SAH presented to ED with falling and weakness.  Patient was alert and oriented, reported that she slowly collapsed against the close abdominal with her back on her gluteal, otherwise did not hit her head or loss of consciousness.  She just felt very weak. Vital signs stable, however sats 88% on room air that improved to 92% on 2 L O2 via [place] COVID-19 negative, influenza negative TSH 3.1, CK1 53 Sodium 126  Spoke at length with daughters. Patient is on optiflow and struggling to breathe. Patient is a very pleasant retired Pharmacist, hospital. She has no exposures to bird or occupational conditions. She is a never smoker and grew up around [place]. She was doing relatively ok. Endorsing some poor appetite. By Wednesday, daughters grew concerned because the patient was becoming increasingly fatigued. Since being admitted, patient has been treated for multifocal pna. Echo shows DD, elevated PASP, but normal EF. Pro BNP elevated. CT showing BL pleural effusions.   Since this morning, patient has had dramatically increasing oxygen requirements.  PAST MEDICAL HISTORY   Past Medical History:  Diagnosis Date   Hypertension    Osteoporosis    Scoliosis      SURGICAL HISTORY   Past Surgical History:  Procedure Laterality Date   CATARACT EXTRACTION W/PHACO Left 07/26/2020   Procedure: CATARACT EXTRACTION PHACO AND INTRAOCULAR LENS PLACEMENT (IOC) LEFT MALYUGIN 5.78 01:12.2 8.0%;  Surgeon: Leandrew Koyanagi, MD;  Location: [facility];  Service: Ophthalmology;  Laterality: Left;    CATARACT EXTRACTION W/PHACO Right 08/16/2020   Procedure: CATARACT EXTRACTION PHACO AND INTRAOCULAR LENS PLACEMENT (IOC) RIGHT MALYUGIN 7.26 01:05.8 11.0%;  Surgeon: Leandrew Koyanagi, MD;  Location: [facility];  Service: Ophthalmology;  Laterality: Right;   DILATION AND CURETTAGE OF UTERUS     TONSILLECTOMY     TUBAL LIGATION       FAMILY HISTORY   Family History  Problem Relation Age of Onset   Breast cancer Neg Hx      SOCIAL HISTORY   Social History   Tobacco Use   Smoking status: Never   Smokeless tobacco: Never  Vaping Use   Vaping Use: Never used  Substance Use Topics   Alcohol use: Not Currently     MEDICATIONS    Home Medication:    Current Medication:  Current Facility-Administered Medications:    alum & mag hydroxide-simeth (MAALOX/MYLANTA) 200-200-20 MG/5ML suspension 15 mL, 15 mL, Oral, Q4H PRN, Nam, Earley Abide, MD, 15 mL at 09/22/22 1958   [DISCONTINUED] glycopyrrolate (ROBINUL) tablet 1 mg, 1 mg, Oral, Q4H PRN **OR** glycopyrrolate (ROBINUL) injection 0.2 mg, 0.2 mg, Subcutaneous, Q4H PRN **OR** glycopyrrolate (ROBINUL) injection 0.2 mg, 0.2 mg, Intravenous, Q4H PRN, Nam, Earley Abide, MD   LORazepam (ATIVAN) injection 2-4 mg, 2-4 mg, Intravenous, Q4H PRN, Nam, Earley Abide, MD   morphine (PF) 2 MG/ML injection 1 mg, 1 mg, Intravenous, Q2H PRN, Nam, Earley Abide, MD, 1 mg at 09/22/22 2210   morphine 100mg  in NS 158mL (1mg /mL) infusion - premix, 1 mg/hr, Intravenous, Continuous, Nam, Earley Abide, MD, Last Rate: 1 mL/hr at September 28, 2022 0800,  1 mg/hr at September 25, 2022 0800    ALLERGIES   Patient has no known allergies.     REVIEW OF SYSTEMS  Review of Systems: Gen:  Denies  fever, sweats, chills weigh loss  HEENT: Denies blurred vision, double vision, ear pain, eye pain, hearing loss, nose bleeds, sore throat Cardiac:  No dizziness, chest pain or heaviness, chest tightness,edema Resp:   Denies cough or sputum porduction, shortness of breath,wheezing, hemoptysis,  Gi:  Denies swallowing difficulty, stomach pain, nausea or vomiting, diarrhea, constipation, bowel incontinence Gu:  Denies bladder incontinence, burning urine Ext:   Denies Joint pain, stiffness or swelling Skin: Denies  skin rash, easy bruising or bleeding or hives Endoc:  Denies polyuria, polydipsia , polyphagia or weight change Psych:   Denies depression, insomnia or hallucinations  Other:  All other systems negative   VS: BP (!) 110/46   Pulse 81   Temp 98.1 F (36.7 C) (Axillary)   Resp 20   Ht 5\' 1"  (1.549 m)   Wt 56.1 kg   SpO2 (!) 86%   BMI 23.37 kg/m     PHYSICAL EXAM  General Appearance: No distress  Pulmonary: Coarse BS bilaterally CardiovascularNormal S1,S2.  No m/r/g.   Abdomen: Benign, Soft, non-tender. Skin: warm, no rashes, no ecchymosis  Extremities: normal, no cyanosis, clubbing. Neuro:without focal findings,  speech normal  PSYCHIATRIC: Mood, affect within normal limits.  IMAGING    US Venous Img Lower Bilateral (DVT)  Result Date: 09/21/2022 CLINICAL DATA:  Edema EXAM: BILATERAL LOWER EXTREMITY VENOUS DOPPLER ULTRASOUND TECHNIQUE: Gray-scale sonography with graded compression, as well as color Doppler and duplex ultrasound were performed to evaluate the lower extremity deep venous systems from the level of the common femoral vein and including the common femoral, femoral, profunda femoral, popliteal and calf veins including the posterior tibial, peroneal and gastrocnemius veins when visible. The superficial great saphenous vein was also interrogated. Spectral Doppler was utilized to evaluate flow at rest and with distal augmentation maneuvers in the common femoral, femoral and popliteal veins. COMPARISON:  None Available. FINDINGS: RIGHT LOWER EXTREMITY Common Femoral Vein: No evidence of thrombus. Normal compressibility, respiratory phasicity and response to augmentation. Saphenofemoral Junction: No evidence of thrombus. Normal compressibility and flow on color  Doppler imaging. Profunda Femoral Vein: No evidence of thrombus. Normal compressibility and flow on color Doppler imaging. Femoral Vein: No evidence of thrombus. Normal compressibility, respiratory phasicity and response to augmentation. Popliteal Vein: No evidence of thrombus. Normal compressibility, respiratory phasicity and response to augmentation. Calf Veins: No evidence of thrombus. Normal compressibility and flow on color Doppler imaging. LEFT LOWER EXTREMITY Common Femoral Vein: No evidence of thrombus. Normal compressibility, respiratory phasicity and response to augmentation. Saphenofemoral Junction: No evidence of thrombus. Normal compressibility and flow on color Doppler imaging. Profunda Femoral Vein: No evidence of thrombus. Normal compressibility and flow on color Doppler imaging. Femoral Vein: No evidence of thrombus. Normal compressibility, respiratory phasicity and response to augmentation. Popliteal Vein: No evidence of thrombus. Normal compressibility, respiratory phasicity and response to augmentation. Calf Veins: No evidence of thrombus. Normal compressibility and flow on color Doppler imaging. Other Findings:  None. IMPRESSION: No evidence of deep venous thrombosis in either lower extremity. Electronically Signed   By: Albin Felling M.D.   On: 09/21/2022 12:42   CT Angio Chest Pulmonary Embolism (PE) W or WO Contrast  Result Date: 09/20/2022 CLINICAL DATA:  Pulmonary embolism (PE) suspected, high prob EXAM: CT ANGIOGRAPHY CHEST WITH CONTRAST TECHNIQUE: Multidetector CT imaging of the chest was  performed using the standard protocol during bolus administration of intravenous contrast. Multiplanar CT image reconstructions and MIPs were obtained to evaluate the vascular anatomy. RADIATION DOSE REDUCTION: This exam was performed according to the departmental dose-optimization program which includes automated exposure control, adjustment of the mA and/or kV according to patient size and/or use  of iterative reconstruction technique. CONTRAST:  40mL OMNIPAQUE IOHEXOL 350 MG/ML SOLN COMPARISON:  X-ray 09/19/2022 FINDINGS: Cardiovascular: Satisfactory opacification of the pulmonary arteries to the segmental level. No evidence of pulmonary embolism. Thoracic aorta is nonaneurysmal. Scattered atherosclerotic vascular calcifications of the aorta and coronary arteries. Normal heart size. Trace pericardial effusion. Mediastinum/Nodes: No enlarged mediastinal, hilar, or axillary lymph nodes. Thyroid gland, trachea, and esophagus demonstrate no significant findings. Lungs/Pleura: Moderate layering bilateral pleural effusions. Right greater than left bibasilar atelectasis. Widespread ground-glass opacities bilaterally with more confluent airspace consolidation in the right upper lobe. No pneumothorax. Upper Abdomen: Renal sinus cyst versus hydronephrosis involving the left kidney, which is atrophic. Small hiatal hernia. Musculoskeletal: Scoliotic thoracolumbar curvature. No acute bony or chest wall findings. Review of the MIP images confirms the above findings. IMPRESSION: 1. Negative for pulmonary embolism. 2. Widespread ground-glass opacities bilaterally with more confluent airspace consolidation in the right upper lobe, suggestive of pulmonary edema. Superimposed pneumonia not excluded. 3. Moderate layering bilateral pleural effusions with right greater than left bibasilar atelectasis. 4. Trace pericardial effusion. 5. Renal sinus cyst versus hydronephrosis involving the left kidney, which is atrophic. Dedicated renal ultrasound recommended for further assessment. 6. Small hiatal hernia. 7. Aortic and coronary artery atherosclerosis (ICD10-I70.0). Electronically Signed   By: Davina Poke D.O.   On: 09/20/2022 11:09   ECHOCARDIOGRAM COMPLETE  Result Date: 09/19/2022    ECHOCARDIOGRAM REPORT   Patient Name:   Loy E Rusconi Date of Exam: 09/19/2022 Medical Rec #:  [id number]  Height:       61.0 in Accession #:     [id number] Weight:       111.1 lb Date of Birth:  Jun 11, 1931  BSA:          1.471 m Patient Age:    14 years   BP:           149/66 mmHg Patient Gender: F          HR:           88 bpm. Exam Location:  [facility] Procedure: 2D Echo, Color Doppler and Cardiac Doppler Indications:     Dyspnea R06.00  History:         Patient has no prior history of Echocardiogram examinations.                  Risk Factors:Hypertension.  Sonographer:     Sherrie Sport Referring Phys:  [id number] RIPUDEEP K RAI Diagnosing Phys: Ida Rogue MD  Sonographer Comments: Image quality was good. IMPRESSIONS  1. Left ventricular ejection fraction, by estimation, is 60 to 65%. The left ventricle has normal function. The left ventricle has no regional wall motion abnormalities. Left ventricular diastolic parameters are consistent with Grade I diastolic dysfunction (impaired relaxation).  2. Right ventricular systolic function is normal. The right ventricular size is normal. There is moderately elevated pulmonary artery systolic pressure. The estimated right ventricular systolic pressure is 0000000 mmHg.  3. The mitral valve is normal in structure. Mild mitral valve regurgitation. No evidence of mitral stenosis. There is mild late systolic prolapse of both leaflets of the mitral valve.  4. Tricuspid valve regurgitation is moderate.  5. The  aortic valve is normal in structure. Aortic valve regurgitation is mild. Aortic valve sclerosis is present, with no evidence of aortic valve stenosis.  6. The inferior vena cava is normal in size with greater than 50% respiratory variability, suggesting right atrial pressure of 3 mmHg. FINDINGS  Left Ventricle: Left ventricular ejection fraction, by estimation, is 60 to 65%. The left ventricle has normal function. The left ventricle has no regional wall motion abnormalities. The left ventricular internal cavity size was normal in size. There is  no left ventricular hypertrophy. Left ventricular diastolic parameters are  consistent with Grade I diastolic dysfunction (impaired relaxation). Right Ventricle: The right ventricular size is normal. No increase in right ventricular wall thickness. Right ventricular systolic function is normal. There is moderately elevated pulmonary artery systolic pressure. The tricuspid regurgitant velocity is 3.61 m/s, and with an assumed right atrial pressure of 5 mmHg, the estimated right ventricular systolic pressure is 34.1 mmHg. Left Atrium: Left atrial size was normal in size. Right Atrium: Right atrial size was normal in size. Pericardium: There is no evidence of pericardial effusion. Mitral Valve: The mitral valve is normal in structure. There is mild late systolic prolapse of both leaflets of the mitral valve. There is moderate thickening of the mitral valve leaflet(s). Mild mitral valve regurgitation. No evidence of mitral valve stenosis. Tricuspid Valve: The tricuspid valve is normal in structure. Tricuspid valve regurgitation is moderate . No evidence of tricuspid stenosis. Aortic Valve: The aortic valve is normal in structure. Aortic valve regurgitation is mild. Aortic valve sclerosis is present, with no evidence of aortic valve stenosis. Aortic valve mean gradient measures 2.0 mmHg. Aortic valve peak gradient measures 4.0  mmHg. Aortic valve area, by VTI measures 4.00 cm. Pulmonic Valve: The pulmonic valve was normal in structure. Pulmonic valve regurgitation is not visualized. No evidence of pulmonic stenosis. Aorta: The aortic root is normal in size and structure. Venous: The inferior vena cava is normal in size with greater than 50% respiratory variability, suggesting right atrial pressure of 3 mmHg. IAS/Shunts: No atrial level shunt detected by color flow Doppler.  LEFT VENTRICLE PLAX 2D LVIDd:         3.70 cm   Diastology LVIDs:         2.30 cm   LV e' medial:    5.87 cm/s LV PW:         1.00 cm   LV E/e' medial:  15.2 LV IVS:        0.80 cm   LV e' lateral:   8.05 cm/s LVOT diam:      2.00 cm   LV E/e' lateral: 11.1 LV SV:         63 LV SV Index:   43 LVOT Area:     3.14 cm  RIGHT VENTRICLE RV Basal diam:  2.70 cm RV Mid diam:    2.30 cm LEFT ATRIUM             Index        RIGHT ATRIUM           Index LA diam:        2.70 cm 1.84 cm/m   RA Area:     14.90 cm LA Vol (A2C):   25.7 ml 17.47 ml/m  RA Volume:   36.30 ml  24.67 ml/m LA Vol (A4C):   29.7 ml 20.19 ml/m LA Biplane Vol: 27.8 ml 18.90 ml/m  AORTIC VALVE AV Area (Vmax):    3.42 cm AV  Area (Vmean):   2.98 cm AV Area (VTI):     4.00 cm AV Vmax:           100.00 cm/s AV Vmean:          67.100 cm/s AV VTI:            0.158 m AV Peak Grad:      4.0 mmHg AV Mean Grad:      2.0 mmHg LVOT Vmax:         109.00 cm/s LVOT Vmean:        63.600 cm/s LVOT VTI:          0.201 m LVOT/AV VTI ratio: 1.27  AORTA Ao Root diam: 3.42 cm MITRAL VALVE                TRICUSPID VALVE MV Area (PHT): 3.36 cm     TR Peak grad:   52.1 mmHg MV Decel Time: 226 msec     TR Vmax:        361.00 cm/s MV E velocity: 89.10 cm/s MV A velocity: 113.00 cm/s  SHUNTS MV E/A ratio:  0.79         Systemic VTI:  0.20 m                             Systemic Diam: 2.00 cm Ida Rogue MD Electronically signed by Ida Rogue MD Signature Date/Time: 09/19/2022/4:45:18 PM    Final    DG Chest 2 View  Result Date: 09/19/2022 CLINICAL DATA:  Multifocal pneumonia. EXAM: CHEST - 2 VIEW COMPARISON:  One-view chest x-ray 09/16/2022 FINDINGS: Heart size is normal. Right upper lobe and bilateral lower lobes, right greater than left airspace opacities have increased. Bilateral pleural effusions have increased, right greater than left. The visualized soft tissues and bony thorax are unremarkable. IMPRESSION: Increasing right upper lobe and bilateral lower lobes, right greater than left airspace opacities and bilateral pleural effusions, right greater than left. Findings are compatible with multifocal pneumonia. Increased opacities may be due to disease progression or rehydration.  Electronically Signed   By: San Morelle M.D.   On: 09/19/2022 08:53   CT Head Wo Contrast  Result Date: 09/27/2022 CLINICAL DATA:  Mental status change, unknown cause EXAM: CT HEAD WITHOUT CONTRAST TECHNIQUE: Contiguous axial images were obtained from the base of the skull through the vertex without intravenous contrast. RADIATION DOSE REDUCTION: This exam was performed according to the departmental dose-optimization program which includes automated exposure control, adjustment of the mA and/or kV according to patient size and/or use of iterative reconstruction technique. COMPARISON:  08/17/2022 FINDINGS: Brain: No evidence of acute infarction, hemorrhage, hydrocephalus, extra-axial collection or mass lesion/mass effect. Focal encephalomalacia in the anterior right frontal lobe at site of previously seen hemorrhagic contusion. No residual blood products are evident by CT. Patchy low-density changes within the periventricular and subcortical white matter compatible with chronic microvascular ischemic change. Mild diffuse cerebral volume loss. Vascular: No hyperdense vessel or unexpected calcification. Skull: Normal. Negative for fracture or focal lesion. Sinuses/Orbits: No acute finding. Other: None. IMPRESSION: 1. No acute intracranial abnormality. 2. Focal encephalomalacia in the anterior right frontal lobe at site of previously seen hemorrhagic contusion. No residual blood products are evident by CT. Electronically Signed   By: Davina Poke D.O.   On: 09/02/2022 13:32   DG Chest Portable 1 View  Result Date: 09/01/2022 CLINICAL DATA:  Cough and weakness. EXAM: PORTABLE CHEST 1  VIEW COMPARISON:  None Available. FINDINGS: The cardiac silhouette, mediastinal and hilar contours are within normal limits. Bilateral pulmonary infiltrates consistent with polylobar pneumonia. There are associated bilateral parapneumonic effusions. No pulmonary lesions or pneumothorax. The bony thorax is intact.  IMPRESSION: Polylobar pneumonia and parapneumonic effusions. Electronically Signed   By: Marijo Sanes M.D.   On: 09/10/2022 13:12      ASSESSMENT/PLAN  88 year old female with no smoking history who presented with fatigue and now presenting with abnormal imaging. I can't find old axial images of her chest. I wonder when this all developed. She was appropriately treated for multifocal pneumonia. She has BL pleural effusions which would seem c/w the elevated pro BNP. But her Ef is normal. She does have some DD and elevated PASP. Does she have [place]? And what is the [facility] related to? Regardless, she has not responded to the more aggressive diuresis in the last 12 hours. Sure, it looks like fluid. But I see some traction bronchiectasis, mosaic attenuation, and some GGOs. I wonder if she has some ILD? Or something underneath the fluid. The family is reasonable and wants to be aggressive. But appropriately so. Since she was doing so well and this is an abrupt departure to her normal health and functional status.   Pt transitioned to Comfort Measures Only on 09/22/22 - Continue morphine gt and prn morphine for pain and/or air hunger  - Continue prn ativan for comfort  - Continue glycopyrrolate for excessive secretions  - Continue HHFNC for pt comfort  - Will consult palliative care to follow along  - Transfer to medsurg orders placed      Donell Beers, [place] Pager [phone] (please enter 7 digits) PCCM Consult Pager [phone] (please enter 7 digits)

## 2022-10-02 DEATH — deceased
